# Patient Record
Sex: Male | Born: 1990
Health system: Southern US, Community
[De-identification: ages and names within clinical notes are randomized; demographics above are authoritative.]

## PROBLEM LIST (undated history)

## (undated) DIAGNOSIS — F909 Attention-deficit hyperactivity disorder, unspecified type: Secondary | ICD-10-CM

## (undated) DIAGNOSIS — E079 Disorder of thyroid, unspecified: Secondary | ICD-10-CM

## (undated) DIAGNOSIS — T7840XA Allergy, unspecified, initial encounter: Secondary | ICD-10-CM

## (undated) DIAGNOSIS — F1190 Opioid use, unspecified, uncomplicated: Secondary | ICD-10-CM

## (undated) HISTORY — DX: Allergy, unspecified, initial encounter: T78.40XA

## (undated) HISTORY — DX: Attention-deficit hyperactivity disorder, unspecified type: F90.9

## (undated) HISTORY — DX: Disorder of thyroid, unspecified: E07.9

---
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
## 2009-10-19 ENCOUNTER — Emergency Department (HOSPITAL_COMMUNITY): Admission: EM | Admit: 2009-10-19 | Discharge: 2009-10-19 | Payer: Self-pay | Admitting: Emergency Medicine

## 2010-04-09 ENCOUNTER — Emergency Department (HOSPITAL_COMMUNITY): Admission: EM | Admit: 2010-04-09 | Discharge: 2010-04-09 | Payer: Self-pay | Admitting: Emergency Medicine

## 2010-06-23 ENCOUNTER — Inpatient Hospital Stay (HOSPITAL_COMMUNITY): Admission: EM | Admit: 2010-06-23 | Discharge: 2010-06-25 | Payer: Self-pay | Source: Home / Self Care

## 2010-09-27 LAB — POCT CARDIAC MARKERS
CKMB, poc: 1.4 ng/mL (ref 1.0–8.0)
CKMB, poc: 1.4 ng/mL (ref 1.0–8.0)
Myoglobin, poc: 83.2 ng/mL (ref 12–200)
Myoglobin, poc: 91.5 ng/mL (ref 12–200)
Troponin i, poc: <0.05 ng/mL (ref 0.00–0.09)

## 2010-09-27 LAB — HEPATIC FUNCTION PANEL
ALT: 20 U/L (ref 0–53)
AST: 21 U/L (ref 0–37)
Alkaline Phosphatase: 110 U/L (ref 39–117)
Indirect Bilirubin: 1.1 mg/dL — ABNORMAL HIGH (ref 0.3–0.9)
Indirect Bilirubin: 2.1 mg/dL — ABNORMAL HIGH (ref 0.3–0.9)
Total Bilirubin: 1.3 mg/dL — ABNORMAL HIGH (ref 0.3–1.2)
Total Protein: 8.3 g/dL (ref 6.0–8.3)

## 2010-09-27 LAB — BASIC METABOLIC PANEL
BUN: 19 mg/dL (ref 6–23)
CO2: 10 mEq/L — ABNORMAL LOW (ref 19–32)
CO2: 19 mEq/L (ref 19–32)
CO2: 20 mEq/L (ref 19–32)
Calcium: 9 mg/dL (ref 8.4–10.5)
Calcium: 9.4 mg/dL (ref 8.4–10.5)
Chloride: 103 mEq/L (ref 96–112)
Chloride: 106 mEq/L (ref 96–112)
Chloride: 106 mEq/L (ref 96–112)
Creatinine, Ser: 0.96 mg/dL (ref 0.4–1.5)
Creatinine, Ser: 1.22 mg/dL (ref 0.4–1.5)
Creatinine, Ser: 2.12 mg/dL — ABNORMAL HIGH (ref 0.4–1.5)
GFR calc Af Amer: 56 mL/min — ABNORMAL LOW (ref >60)
GFR calc Af Amer: >60 mL/min (ref >60)
GFR calc Af Amer: >60 mL/min (ref >60)
GFR calc Af Amer: >60 mL/min (ref >60)
GFR calc non Af Amer: 40 mL/min — ABNORMAL LOW (ref >60)
GFR calc non Af Amer: >60 mL/min (ref >60)
GFR calc non Af Amer: >60 mL/min (ref >60)
Potassium: 4 mEq/L (ref 3.5–5.1)
Sodium: 135 mEq/L (ref 135–145)
Sodium: 136 mEq/L (ref 135–145)

## 2010-09-27 LAB — CBC
Hemoglobin: 15.2 g/dL (ref 13.0–17.0)
Hemoglobin: 15.2 g/dL (ref 13.0–17.0)
MCH: 32.5 pg (ref 26.0–34.0)
MCH: 32.5 pg (ref 26.0–34.0)
MCV: 87.8 fL (ref 78.0–100.0)
MCV: 88.5 fL (ref 78.0–100.0)
Platelets: 196 10*3/uL (ref 150–400)
Platelets: 284 10*3/uL (ref 150–400)
RBC: 4.68 MIL/uL (ref 4.22–5.81)
RBC: 4.68 MIL/uL (ref 4.22–5.81)
RDW: 12.9 % (ref 11.5–15.5)
WBC: 23.2 10*3/uL — ABNORMAL HIGH (ref 4.0–10.5)

## 2010-09-27 LAB — DIFFERENTIAL
Basophils Absolute: 0 10*3/uL (ref 0.0–0.1)
Eosinophils Absolute: 0 10*3/uL (ref 0.0–0.7)
Eosinophils Absolute: 0.1 10*3/uL (ref 0.0–0.7)
Eosinophils Relative: 0 % (ref 0–5)
Eosinophils Relative: 1 % (ref 0–5)
Lymphocytes Relative: 8 % — ABNORMAL LOW (ref 12–46)
Lymphs Abs: 1 10*3/uL (ref 0.7–4.0)
Lymphs Abs: 1.6 10*3/uL (ref 0.7–4.0)
Monocytes Relative: 9 % (ref 3–12)
Monocytes Relative: 9 % (ref 3–12)
Neutro Abs: 19.8 10*3/uL — ABNORMAL HIGH (ref 1.7–7.7)
Neutrophils Relative %: 81 % — ABNORMAL HIGH (ref 43–77)
Neutrophils Relative %: 86 % — ABNORMAL HIGH (ref 43–77)

## 2010-09-27 LAB — GLUCOSE, CAPILLARY
Comment 1: 326631
Glucose-Capillary: 130 mg/dL — ABNORMAL HIGH (ref 70–99)
Glucose-Capillary: 137 mg/dL — ABNORMAL HIGH (ref 70–99)
Glucose-Capillary: 138 mg/dL — ABNORMAL HIGH (ref 70–99)
Glucose-Capillary: 157 mg/dL — ABNORMAL HIGH (ref 70–99)
Glucose-Capillary: 161 mg/dL — ABNORMAL HIGH (ref 70–99)
Glucose-Capillary: 171 mg/dL — ABNORMAL HIGH (ref 70–99)
Glucose-Capillary: 180 mg/dL — ABNORMAL HIGH (ref 70–99)
Glucose-Capillary: 184 mg/dL — ABNORMAL HIGH (ref 70–99)
Glucose-Capillary: 193 mg/dL — ABNORMAL HIGH (ref 70–99)
Glucose-Capillary: 197 mg/dL — ABNORMAL HIGH (ref 70–99)
Glucose-Capillary: 201 mg/dL — ABNORMAL HIGH (ref 70–99)
Glucose-Capillary: 243 mg/dL — ABNORMAL HIGH (ref 70–99)
Glucose-Capillary: 466 mg/dL — ABNORMAL HIGH (ref 70–99)
Glucose-Capillary: 480 mg/dL — ABNORMAL HIGH (ref 70–99)

## 2010-09-27 LAB — URINALYSIS, ROUTINE W REFLEX MICROSCOPIC: Urobilinogen, UA: 0.2 mg/dL (ref 0.0–1.0)

## 2010-09-27 LAB — POTASSIUM
Potassium: 3.2 mEq/L — ABNORMAL LOW (ref 3.5–5.1)
Potassium: 3.2 mEq/L — ABNORMAL LOW (ref 3.5–5.1)

## 2010-09-27 LAB — CARDIAC PANEL(CRET KIN+CKTOT+MB+TROPI)
Relative Index: INVALID (ref 0.0–2.5)
Troponin I: 0.01 ng/mL (ref 0.00–0.06)

## 2010-09-27 LAB — CK TOTAL AND CKMB (NOT AT ARMC)
CK, MB: 2.8 ng/mL (ref 0.3–4.0)
Relative Index: INVALID (ref 0.0–2.5)
Total CK: 54 U/L (ref 7–232)

## 2010-09-27 LAB — URINE MICROSCOPIC-ADD ON

## 2010-10-06 LAB — DIFFERENTIAL
Eosinophils Absolute: 0.1 10*3/uL (ref 0.0–0.7)
Lymphs Abs: 1.3 10*3/uL (ref 0.7–4.0)
Monocytes Relative: 6 % (ref 3–12)
Neutrophils Relative %: 75 % (ref 43–77)

## 2010-10-06 LAB — URINALYSIS, ROUTINE W REFLEX MICROSCOPIC
Bilirubin Urine: NEGATIVE
Glucose, UA: >1000 mg/dL — AB
Hgb urine dipstick: NEGATIVE
Ketones, ur: NEGATIVE mg/dL
Specific Gravity, Urine: 1.005 (ref 1.005–1.030)
pH: 5.5 (ref 5.0–8.0)

## 2010-10-06 LAB — CBC
MCV: 92.8 fL (ref 78.0–100.0)
RBC: 4.95 MIL/uL (ref 4.22–5.81)
WBC: 7.1 10*3/uL (ref 4.0–10.5)

## 2010-10-06 LAB — RAPID URINE DRUG SCREEN, HOSP PERFORMED
Amphetamines: NOT DETECTED
Barbiturates: NOT DETECTED
Cocaine: POSITIVE — AB
Opiates: NOT DETECTED
Tetrahydrocannabinol: POSITIVE — AB

## 2010-10-06 LAB — BASIC METABOLIC PANEL
Chloride: 96 mEq/L (ref 96–112)
Creatinine, Ser: 0.94 mg/dL (ref 0.4–1.5)
Potassium: 3.9 mEq/L (ref 3.5–5.1)
Sodium: 134 mEq/L — ABNORMAL LOW (ref 135–145)

## 2010-10-06 LAB — POCT CARDIAC MARKERS
CKMB, poc: <1 ng/mL — ABNORMAL LOW (ref 1.0–8.0)
Myoglobin, poc: 44 ng/mL (ref 12–200)

## 2010-10-06 LAB — GLUCOSE, CAPILLARY: Glucose-Capillary: 298 mg/dL — ABNORMAL HIGH (ref 70–99)

## 2010-12-03 NOTE — Procedures (Signed)
NAME:  Chase Benson, Chase Benson                        ACCOUNT NO.:  [id number]   MEDICAL RECORD NO.:  [id number]                   PATIENT TYPE:  EMS   LOCATION:  ED                                   FACILITY:  [facility]   PHYSICIAN:  Edward L. Juanetta Gosling, M.D.             DATE OF BIRTH:  [birth year]   DATE OF PROCEDURE:  DATE OF DISCHARGE:                                EKG INTERPRETATION   FINDINGS:  1. The rhythm is probably sinus arrhythmia with a general rate in the 60's.     There are extensive ST-T wave abnormalities which may be due to early     repolarization, but could be from other cause.  Clinical correlation is     suggested.  2. There is short PR interval.   IMPRESSION:  Abnormal electrocardiogram.      ___________________________________________                                            Oneal Deputy. Juanetta Gosling, M.D.   ELH/MEDQ  D:  11/13/2003  T:  11/13/2003  Job:  [id number]

## 2010-12-03 NOTE — H&P (Signed)
[facility]  Patient:    Chase Benson, Chase Benson Visit Number: [id number] MRN: [id number]          Service Type: MED Location: 3A A328 01 Attending Physician:  Harlow Asa Dictated by:   Donna Bernard, M.D. Admit Date:  02/07/2002                           History and Physical  CHIEF COMPLAINT:  Frequent urination and low back discomfort.  SUBJECTIVE:  This patient is a soon to be 20 year old white male with a relatively benign prior medical history including ADHD who presented to the office day of admission with complaints of frequent urination.  The father noted last week that he was peeing more often and drinking more often.  In the last couple of days the patient had more difficulty with this.  He also noted some mild low back ache accompanied by some mild headache.  No fever.  He took no medications other than Aleve for his symptoms.  The patient has had no change in alertness or awareness.  He notes no dysuria.  He has had no respiratory symptoms.  No cough.  No fever.  CURRENT MEDICATIONS:  None.  During the school year he takes Ritalin 20 SR daily.  PAST MEDICAL HISTORY:  Significant for ADHD.  Also, patient had a transient period of hematuria which resolved spontaneously several years ago.  PAST SURGICAL HISTORY:  None.  PRIOR ADMISSIONS:  None.  FAMILY HISTORY:  Positive for both type 1 and type 2 diabetes.  SOCIAL HISTORY:  Lives with parents.  No significant smoke exposure.  REVIEW OF SYSTEMS:  Otherwise vital signs stable.  PHYSICAL EXAMINATION  VITAL SIGNS:  Blood pressure 112/70, afebrile.  Random glucose in the office 276, weight 86 pounds.  GENERAL:  The patient is alert, in no acute distress.  HEENT:  Normal.  NECK:  Supple.  LUNGS:  Clear.  HEART:  Regular rate and rhythm.  ABDOMEN:  Benign.  No CVA tenderness.  EXTREMITIES:  Normal.  NEUROLOGIC:  Intact.  SKIN:  Normal.  SIGNIFICANT LABORATORIES:  Random  sugar at the office 276.  Urinalysis at the office showed positive glycosuria.  Blood work at hospital, MET-7 normal except for glucose 158, hemoglobin A1C level pending.  IMPRESSION:  New onset diabetes with no ketosis at this time.  Blood ketone levels were negative.  Bicarbonate in fact is 29.  With the patients habitus, family history of type 1, relatively acute onset of symptoms, I think the likelihood is this is type 1 diabetes.  This is discussed with family.  PLAN:  As per orders.  Significant time spent with the family in discussion of diagnosis and underlying cause and long-term prognosis and treatment. Dictated by:   Donna Bernard, M.D. Attending Physician:  Harlow Asa DD:  02/07/02 TD:  02/11/02 Job: [id number]

## 2010-12-03 NOTE — Discharge Summary (Signed)
   NAME:  Chase Benson, Chase Benson                        ACCOUNT NO.:  [id number]   MEDICAL RECORD NO.:  [id number]                   PATIENT TYPE:  INP   LOCATION:  A328                                 FACILITY:  [facility]   PHYSICIAN:  Donna Bernard, M.D.             DATE OF BIRTH:  [birth year]   DATE OF ADMISSION:  02/07/2002  DATE OF DISCHARGE:  02/10/2002                                 DISCHARGE SUMMARY   FINAL DIAGNOSIS:  New onset type 1 diabetes.   FINAL DISPOSITION:  1. The patient is discharged to home.  2. Discharge insulin:  Humulin 70/30 eight units q.a.m. and 4 units q.p.m.     Humulin R 2 units in addition to the 70/30 for glucose readings above     400.  Glucagon injection prescribed to be used in the events of severe     hypoglycemia.  3. Followup in the office within several days.  4. A 1700 ADA diet.   HISTORY AND PHYSICAL:  Please see H&P as dictated   HOSPITAL COURSE:  This patient is a 20 year old white male who presented  with a history of frequent urination for the week prior to admission.  Sugar  in the office revealed a level of 276.  Based on new onset type 1 diabetes,  the patient was admitted to the hospital.  He was put on a sliding scale.  The patient was administered IV fluids. Over the next several days, we  participated in intensive education of the patient in regards to his  condition.  The family and patient were taught appropriate survival skills  for management of type 1 diabetes.  Of note, hemoglobin A1c came back  positive at 8.8%.  In addition, the blood work supported the presence of no  ketosis at the time of presentation.  The day of discharge, the patient was  feeling better and he was discharged home with diagnosis and disposition as  noted above.                                                Donna Bernard, M.D.    WSL/MEDQ  D:  04/02/2002  T:  04/03/2002  Job:  [id number]

## 2012-02-24 ENCOUNTER — Emergency Department (HOSPITAL_COMMUNITY)
Admission: EM | Admit: 2012-02-24 | Discharge: 2012-02-24 | Disposition: A | Discharge status: Home / Self Care | Payer: Self-pay | Attending: Emergency Medicine | Admitting: Emergency Medicine

## 2012-02-24 ENCOUNTER — Encounter (HOSPITAL_COMMUNITY): Payer: Self-pay | Admitting: *Deleted

## 2012-02-24 DIAGNOSIS — Z79899 Other long term (current) drug therapy: Secondary | ICD-10-CM | POA: Insufficient documentation

## 2012-02-24 DIAGNOSIS — F17200 Nicotine dependence, unspecified, uncomplicated: Secondary | ICD-10-CM | POA: Insufficient documentation

## 2012-02-24 DIAGNOSIS — K089 Disorder of teeth and supporting structures, unspecified: Secondary | ICD-10-CM | POA: Insufficient documentation

## 2012-02-24 DIAGNOSIS — Z794 Long term (current) use of insulin: Secondary | ICD-10-CM | POA: Insufficient documentation

## 2012-02-24 DIAGNOSIS — K0889 Other specified disorders of teeth and supporting structures: Secondary | ICD-10-CM

## 2012-02-24 DIAGNOSIS — E119 Type 2 diabetes mellitus without complications: Secondary | ICD-10-CM | POA: Insufficient documentation

## 2012-02-24 MED ORDER — HYDROCODONE-ACETAMINOPHEN 5-325 MG PO TABS: 1.0000 | ORAL_TABLET | Freq: Four times a day (QID) | ORAL | Status: AC | PRN

## 2012-02-24 MED ORDER — HYDROCODONE-ACETAMINOPHEN 5-325 MG PO TABS
1.0000 | ORAL_TABLET | Freq: Once | ORAL | Status: AC
  Administered 2012-02-24: 1 via ORAL
  Filled 2012-02-24: qty 1

## 2012-02-24 MED ORDER — PENICILLIN V POTASSIUM 500 MG PO TABS: 500.0000 mg | ORAL_TABLET | Freq: Four times a day (QID) | ORAL | Status: AC

## 2012-02-24 MED ORDER — PENICILLIN V POTASSIUM 250 MG PO TABS
500.0000 mg | ORAL_TABLET | Freq: Once | ORAL | Status: AC
  Administered 2012-02-24: 500 mg via ORAL
  Filled 2012-02-24 (×2): qty 1

## 2012-02-24 NOTE — ED Notes (Signed)
Dental pain lt mandibular molar 

## 2012-02-24 NOTE — ED Provider Notes (Signed)
History     CSN: [id number]  Arrival date & time 02/24/12  1400   First MD Initiated Contact with Patient 02/24/12 1426      Chief Complaint  Patient presents with  . Dental Pain    (Consider location/radiation/quality/duration/timing/severity/associated sxs/prior treatment) HPI Comments: Plans to see a local dentist next week.  Patient is a 21 y.o. male presenting with tooth pain. The history is provided by the patient. No language interpreter was used.  Dental PainThe primary symptoms include mouth pain. Primary symptoms do not include dental injury or fever. Episode onset: 3 days ago. The symptoms are unchanged. The symptoms occur constantly.  Additional symptoms include: dental sensitivity to temperature. Additional symptoms do not include: gum swelling, purulent gums, trismus and facial swelling.    Past Medical History  Diagnosis Date  . Diabetes mellitus     History reviewed. No pertinent past surgical history.  History reviewed. No pertinent family history.  History  Substance Use Topics  . Smoking status: Current Everyday Smoker  . Smokeless tobacco: Not on file  . Alcohol Use: No      Review of Systems  Constitutional: Negative for fever and chills.  HENT: Positive for dental problem. Negative for facial swelling.   All other systems reviewed and are negative.    Allergies  Sulfa antibiotics  Home Medications   Current Outpatient Rx  Name Route Sig Dispense Refill  . ASPIRIN-SALICYLAMIDE-CAFFEINE 325-95-16 MG PO TABS Oral Take 2-3 packets by mouth 3 (three) times daily as needed. For pain    . INSULIN REGULAR HUMAN 100 UNIT/ML IJ SOLN Subcutaneous Inject 50-75 Units into the skin 3 (three) times daily before meals. As directed per sliding scale.    Marland Kitchen HYDROCODONE-ACETAMINOPHEN 5-325 MG PO TABS Oral Take 1 tablet by mouth every 6 (six) hours as needed for pain. 20 tablet 0  . PENICILLIN V POTASSIUM 500 MG PO TABS Oral Take 1 tablet (500 mg total) by  mouth 4 (four) times daily. 40 tablet 0    BP 133/72  Pulse 72  Temp 97.5 F (36.4 C) (Oral)  Resp 20  Ht 6\' 1"  (1.854 m)  Wt 150 lb (68.04 kg)  BMI 19.79 kg/m2  SpO2 100%  Physical Exam  Nursing note and vitals reviewed. Constitutional: He is oriented to person, place, and time. He appears well-developed and well-nourished.  HENT:  Head: Normocephalic and atraumatic.  Mouth/Throat: Uvula is midline, oropharynx is clear and moist and mucous membranes are normal. Normal dentition. No dental abscesses or dental caries.    Eyes: EOM are normal.  Neck: Normal range of motion.  Cardiovascular: Normal rate, regular rhythm, normal heart sounds and intact distal pulses.   Pulmonary/Chest: Effort normal and breath sounds normal. No respiratory distress.  Abdominal: Soft. He exhibits no distension. There is no tenderness.  Musculoskeletal: Normal range of motion.  Neurological: He is alert and oriented to person, place, and time.  Skin: Skin is warm and dry.  Psychiatric: He has a normal mood and affect. Judgment normal.    ED Course  Procedures (including critical care time)  Labs Reviewed - No data to display No results found.   1. Pain, dental       MDM  rx-pen VK 500, 40 rx-hydrocodone, 20 F/u with dentist as planned.        Evalina Field, [place] 02/24/12 1512

## 2012-02-24 NOTE — ED Notes (Signed)
Pt c/o of toothache to left lower x 3 days, states he has a dentist in [place] but has not seen in 2 years, pt states that he will try to make an appt with him but nurse will provide list of dental services in the area as well

## 2012-02-25 NOTE — ED Provider Notes (Signed)
Medical screening examination/treatment/procedure(s) were performed by non-physician practitioner and as supervising physician I was immediately available for consultation/collaboration.  [name], MD 02/25/12 [phone]

## 2012-04-05 ENCOUNTER — Encounter (HOSPITAL_COMMUNITY): Payer: Self-pay | Admitting: Emergency Medicine

## 2012-04-05 ENCOUNTER — Emergency Department (HOSPITAL_COMMUNITY)
Admission: EM | Admit: 2012-04-05 | Discharge: 2012-04-05 | Disposition: A | Discharge status: Home / Self Care | Payer: Self-pay | Attending: Emergency Medicine | Admitting: Emergency Medicine

## 2012-04-05 DIAGNOSIS — F17200 Nicotine dependence, unspecified, uncomplicated: Secondary | ICD-10-CM | POA: Insufficient documentation

## 2012-04-05 DIAGNOSIS — E119 Type 2 diabetes mellitus without complications: Secondary | ICD-10-CM | POA: Insufficient documentation

## 2012-04-05 DIAGNOSIS — Z882 Allergy status to sulfonamides status: Secondary | ICD-10-CM | POA: Insufficient documentation

## 2012-04-05 DIAGNOSIS — K089 Disorder of teeth and supporting structures, unspecified: Secondary | ICD-10-CM | POA: Insufficient documentation

## 2012-04-05 DIAGNOSIS — K0889 Other specified disorders of teeth and supporting structures: Secondary | ICD-10-CM

## 2012-04-05 DIAGNOSIS — Z794 Long term (current) use of insulin: Secondary | ICD-10-CM | POA: Insufficient documentation

## 2012-04-05 MED ORDER — AMOXICILLIN 500 MG PO CAPS: 500.0000 mg | ORAL_CAPSULE | Freq: Three times a day (TID) | ORAL | Status: DC

## 2012-04-05 MED ORDER — HYDROCODONE-ACETAMINOPHEN 5-325 MG PO TABS: 1.0000 | ORAL_TABLET | ORAL | Status: DC | PRN

## 2012-04-05 NOTE — ED Notes (Signed)
Pt has 'toothache' his dentist can't see him until next week and told him to come to ED if pain got worse. Pt reports taking ibuprofen and BC powder for pain. Pt says it is difficult to eat. Pt reports pain at 8/10.

## 2012-04-05 NOTE — ED Provider Notes (Signed)
History     CSN: [id number]  Arrival date & time 04/05/12  1324   First MD Initiated Contact with Patient 04/05/12 1218      Chief Complaint  Patient presents with  . Dental Pain    (Consider location/radiation/quality/duration/timing/severity/associated sxs/prior treatment) Patient is a 21 y.o. male presenting with tooth pain. The history is provided by the patient.  Dental PainThe primary symptoms include mouth pain. Primary symptoms do not include shortness of breath or cough. The symptoms began 2 days ago. The symptoms are worsening. The symptoms occur constantly.  Additional symptoms include: jaw pain. Additional symptoms do not include: nosebleeds. Medical issues include: smoking.    Past Medical History  Diagnosis Date  . Diabetes mellitus     History reviewed. No pertinent past surgical history.  No family history on file.  History  Substance Use Topics  . Smoking status: Current Every Day Smoker  . Smokeless tobacco: Never Used  . Alcohol Use: Yes      Review of Systems  Constitutional: Negative for activity change.       All ROS Neg except as noted in HPI  HENT: Positive for dental problem. Negative for nosebleeds and neck pain.   Eyes: Negative for photophobia and discharge.  Respiratory: Negative for cough, shortness of breath and wheezing.   Cardiovascular: Negative for chest pain and palpitations.  Gastrointestinal: Negative for abdominal pain and blood in stool.  Genitourinary: Negative for dysuria, frequency and hematuria.  Musculoskeletal: Negative for back pain and arthralgias.  Skin: Negative.   Neurological: Negative for dizziness, seizures and speech difficulty.  Psychiatric/Behavioral: Negative for hallucinations and confusion.    Allergies  Sulfa antibiotics  Home Medications   Current Outpatient Rx  Name Route Sig Dispense Refill  . ASPIRIN-SALICYLAMIDE-CAFFEINE 325-95-16 MG PO TABS Oral Take 2-3 packets by mouth 3 (three) times  daily as needed. For pain    . INSULIN REGULAR HUMAN 100 UNIT/ML IJ SOLN Subcutaneous Inject 50-75 Units into the skin 3 (three) times daily before meals. As directed per sliding scale.      BP 100/60  Temp 97.1 F (36.2 C) (Oral)  SpO2 100%  Physical Exam  Nursing note and vitals reviewed. Constitutional: He is oriented to person, place, and time. He appears well-developed and well-nourished.  Non-toxic appearance.  HENT:  Head: Normocephalic.  Right Ear: Tympanic membrane and external ear normal.  Left Ear: Tympanic membrane and external ear normal.       Pain to palpation of the left lower 1st and 2nd molar areas. Mild gum swelling present. No visible abscess. Airway patent. No swelling under the tongue.  Eyes: EOM and lids are normal. Pupils are equal, round, and reactive to light.  Neck: Normal range of motion. Neck supple. Carotid bruit is not present.  Cardiovascular: Normal rate, regular rhythm, normal heart sounds, intact distal pulses and normal pulses.   Pulmonary/Chest: Breath sounds normal. No respiratory distress.  Abdominal: Soft. Bowel sounds are normal. There is no tenderness. There is no guarding.  Musculoskeletal: Normal range of motion.  Lymphadenopathy:       Head (right side): No submandibular adenopathy present.       Head (left side): No submandibular adenopathy present.    He has no cervical adenopathy.  Neurological: He is alert and oriented to person, place, and time. He has normal strength. No cranial nerve deficit or sensory deficit.  Skin: Skin is warm and dry.  Psychiatric: He has a normal mood and affect. His speech  is normal.    ED Course  Procedures (including critical care time)  Labs Reviewed - No data to display No results found.   No diagnosis found.    MDM  I have reviewed nursing notes, vital signs, and all appropriate lab and imaging results for this patient. Pt has problem with toothache on the left lower jaw. He is scheduled for  dental evaluation, but was told to come to the ED for assistance with pain. He is diabetic. Glucose this AM  154. Will treat with amoxil and norco. Pt advised to see Dr Gerda Diss, who manages his glucose for approval of use of these medications.       Chase Benson, [place] 04/05/12 1243

## 2012-04-05 NOTE — ED Provider Notes (Signed)
Medical screening examination/treatment/procedure(s) were performed by non-physician practitioner and as supervising physician I was immediately available for consultation/collaboration.   Benny Lennert, MD 04/05/12 [phone]

## 2013-03-05 ENCOUNTER — Other Ambulatory Visit: Payer: Self-pay | Admitting: *Deleted

## 2013-03-05 ENCOUNTER — Telehealth: Payer: Self-pay | Admitting: Family Medicine

## 2013-03-05 MED ORDER — INSULIN REGULAR HUMAN 100 UNIT/ML IJ SOLN: 50.0000 [IU] | Freq: Three times a day (TID) | INTRAMUSCULAR | Status: DC

## 2013-03-05 NOTE — Telephone Encounter (Signed)
Refill times one. Pt needs to see his diabetes specialist! I don't manage type 1 at his age and he knows that

## 2013-03-05 NOTE — Telephone Encounter (Signed)
FYI - PATIENT STATES HE DOESN'T HAVE A SPECIALIST AND HASN'T SEEN A DOCTOR SINCE HE SAW YOU IN JAN 2013. STATES HE IS GOING TO MAKE AN APPT. HERE TO GET REFERRED TO A DIABETIC DOCTOR SOON.   Med sent electronically to [facility] in [place]. Patient notified.

## 2013-03-05 NOTE — Telephone Encounter (Signed)
LAST SEEN JAN 2013
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
## 2013-06-19 ENCOUNTER — Ambulatory Visit (INDEPENDENT_AMBULATORY_CARE_PROVIDER_SITE_OTHER): Payer: Self-pay | Admitting: Family Medicine

## 2013-06-19 ENCOUNTER — Encounter: Payer: Self-pay | Admitting: Family Medicine

## 2013-06-19 VITALS — BP 138/88 | Ht 73.0 in | Wt 183.2 lb

## 2013-06-19 DIAGNOSIS — E1149 Type 2 diabetes mellitus with other diabetic neurological complication: Secondary | ICD-10-CM

## 2013-06-19 DIAGNOSIS — E039 Hypothyroidism, unspecified: Secondary | ICD-10-CM

## 2013-06-19 DIAGNOSIS — E1142 Type 2 diabetes mellitus with diabetic polyneuropathy: Secondary | ICD-10-CM

## 2013-06-19 DIAGNOSIS — E1140 Type 2 diabetes mellitus with diabetic neuropathy, unspecified: Secondary | ICD-10-CM

## 2013-06-19 DIAGNOSIS — E119 Type 2 diabetes mellitus without complications: Secondary | ICD-10-CM

## 2013-06-19 DIAGNOSIS — Z794 Long term (current) use of insulin: Secondary | ICD-10-CM

## 2013-06-19 DIAGNOSIS — E109 Type 1 diabetes mellitus without complications: Secondary | ICD-10-CM

## 2013-06-19 LAB — POCT GLYCOSYLATED HEMOGLOBIN (HGB A1C): Hemoglobin A1C: 9.2

## 2013-06-19 MED ORDER — INSULIN NPH ISOPHANE & REGULAR (70-30) 100 UNIT/ML ~~LOC~~ SUSP: SUBCUTANEOUS | Status: DC

## 2013-06-19 MED ORDER — INSULIN GLARGINE 100 UNIT/ML ~~LOC~~ SOLN: 34.0000 [IU] | Freq: Every day | SUBCUTANEOUS | Status: DC

## 2013-06-19 MED ORDER — NORTRIPTYLINE HCL 25 MG PO CAPS: 25.0000 mg | ORAL_CAPSULE | Freq: Every day | ORAL | Status: DC

## 2013-06-19 MED ORDER — LEVOTHYROXINE SODIUM 100 MCG PO TABS: ORAL_TABLET | ORAL | Status: DC

## 2013-06-19 NOTE — Patient Instructions (Signed)
Increase lantus to 34  Add two units to each basal dose for each meal

## 2013-06-19 NOTE — Progress Notes (Signed)
   Subjective:    Patient ID: Chase Benson, male    DOB: [birth year], 22 y.o.   MRN: [id number]  HPI Patient is here today for diabetic check up.   Patient states his blood sugars have been in the 200's. 180 and 210. On sliding scale of the 12 each am bs i120, then divide by 42, then add  10 base plus same bs  10 base plus same bs  Exercising regularly, work physically demanding, a lot of physicl acticity  One low sugar spell recntly,  Still on lantus 27 units daily, takes regularly. Uses relion equivalent thru [facility].  Notes numbness and tingling in feet, pins and needles, worse in the eve. At times painful in the evening. Sharp lancinating pain running through both feet.  Reports history of low thyroid. States compliant with medications. Requests refill. Has not had her blood work in a long time. No symptoms of hiatal low thyroid. States it definitely helps him no chest pain no headache no weight loss no weight gain no abdominal pain no change in bowel habits ROS otherwise negative  Last eye doc visit, one yr ago,     Results for orders placed in visit on 06/19/13  POCT GLYCOSYLATED HEMOGLOBIN (HGB A1C)      Result Value Range   Hemoglobin A1C 9.2     Working full time,   Review of Systems See above    Objective:   Physical Exam  Alert no apparent distress. HEENT normal. Lungs clear. Heart regular in rhythm. Ankles without edema. See a foot exam     Assessment & Plan:  Impression #1 type 1 diabetes. #2 history of ongoing noncompliance though improved lately. #3 diabetic neuropathy discussed at length. #4 hypothyroidism status uncertain. Plan insulin refilled. Encouraged to see eye Dr. Stay compliant with meds. Followup as scheduled. Blood work one week before next visit. Easily 30 spent minutes spent most in discussion. Lantus increased. Had 2 units of basal rapid acting insulin teaching meal. WSL

## 2013-06-20 ENCOUNTER — Telehealth: Payer: Self-pay | Admitting: *Deleted

## 2013-06-20 ENCOUNTER — Other Ambulatory Visit: Payer: Self-pay | Admitting: *Deleted

## 2013-06-20 NOTE — Telephone Encounter (Signed)
Pt stated he receive wrong insulin medication at the pharmacy. The wrong medication he picked up was Novolin 70/30.Marland KitchenThe right medication should been Novolin Regular 3 vials. Pt # [phone] [facility]acy [facility]

## 2013-06-20 NOTE — Telephone Encounter (Signed)
Called [facility] canceled refills on the insulin that was sent in wrong. Refilled the novolin regular 3 vial. [facility] stated he paid #24.88 for the insulin and he cannot return. Spoke with Print production planner. Pt to bring the receipt to the office and cone will issue him a refund.

## 2013-06-23 DIAGNOSIS — E039 Hypothyroidism, unspecified: Secondary | ICD-10-CM | POA: Insufficient documentation

## 2013-06-23 DIAGNOSIS — E109 Type 1 diabetes mellitus without complications: Secondary | ICD-10-CM | POA: Insufficient documentation

## 2013-06-23 DIAGNOSIS — E1140 Type 2 diabetes mellitus with diabetic neuropathy, unspecified: Secondary | ICD-10-CM | POA: Insufficient documentation

## 2013-08-10 ENCOUNTER — Encounter: Payer: Self-pay | Admitting: *Deleted

## 2013-08-13 ENCOUNTER — Telehealth: Payer: Self-pay | Admitting: *Deleted

## 2013-08-13 ENCOUNTER — Encounter: Payer: Self-pay | Admitting: Family Medicine

## 2013-08-13 ENCOUNTER — Ambulatory Visit (INDEPENDENT_AMBULATORY_CARE_PROVIDER_SITE_OTHER): Payer: BC Managed Care – PPO | Admitting: Family Medicine

## 2013-08-13 ENCOUNTER — Other Ambulatory Visit: Payer: Self-pay | Admitting: *Deleted

## 2013-08-13 VITALS — BP 122/80 | Ht 73.0 in | Wt 171.0 lb

## 2013-08-13 DIAGNOSIS — E109 Type 1 diabetes mellitus without complications: Secondary | ICD-10-CM

## 2013-08-13 DIAGNOSIS — E1140 Type 2 diabetes mellitus with diabetic neuropathy, unspecified: Secondary | ICD-10-CM

## 2013-08-13 DIAGNOSIS — E039 Hypothyroidism, unspecified: Secondary | ICD-10-CM

## 2013-08-13 DIAGNOSIS — Z0000 Encounter for general adult medical examination without abnormal findings: Secondary | ICD-10-CM

## 2013-08-13 DIAGNOSIS — E1142 Type 2 diabetes mellitus with diabetic polyneuropathy: Secondary | ICD-10-CM

## 2013-08-13 DIAGNOSIS — E1149 Type 2 diabetes mellitus with other diabetic neurological complication: Secondary | ICD-10-CM

## 2013-08-13 DIAGNOSIS — Z79899 Other long term (current) drug therapy: Secondary | ICD-10-CM

## 2013-08-13 MED ORDER — INSULIN GLARGINE 100 UNIT/ML ~~LOC~~ SOLN: 40.0000 [IU] | Freq: Every day | SUBCUTANEOUS | Status: DC

## 2013-08-13 MED ORDER — GABAPENTIN 100 MG PO CAPS: 100.0000 mg | ORAL_CAPSULE | Freq: Every day | ORAL | Status: DC

## 2013-08-13 NOTE — Telephone Encounter (Signed)
We added six units to equal forty

## 2013-08-13 NOTE — Progress Notes (Signed)
   Subjective:    Patient ID: Chase Benson, male    DOB: 01-Apr-1991, 23 y.o.   MRN: [id number]  HPI Patient is here today for a physical.   Pt states blood sugars are coming down, but they are still a little high.   He quit taking the nortriptyline due to the side effects of making him feel fatigue, lethargic, and his blood sugar would crash in the middle of the night.  Results for orders placed in visit on 06/19/13  POCT GLYCOSYLATED HEMOGLOBIN (HGB A1C)      Result Value Range   Hemoglobin A1C 9.2     Sugars up to 180s  Still having painful neuropathy, nortryp causede morn drowsiness  Thyroid med mostly compliant--maybe misses one dose a week  Smoking one half pk a day--working it down  Review of Systems  Constitutional: Negative for fever, activity change and appetite change.  HENT: Negative for congestion and rhinorrhea.   Eyes: Negative for discharge.  Respiratory: Negative for cough and wheezing.   Cardiovascular: Negative for chest pain.  Gastrointestinal: Negative for vomiting, abdominal pain and blood in stool.  Genitourinary: Negative for frequency and difficulty urinating.  Musculoskeletal: Negative for neck pain.  Skin: Negative for rash.  Allergic/Immunologic: Negative for environmental allergies and food allergies.  Neurological: Negative for weakness and headaches.  Psychiatric/Behavioral: Negative for agitation.       Objective:   Physical Exam  Vitals reviewed. Constitutional: He appears well-developed and well-nourished.  HENT:  Head: Normocephalic and atraumatic.  Right Ear: External ear normal.  Left Ear: External ear normal.  Nose: Nose normal.  Mouth/Throat: Oropharynx is clear and moist.  Eyes: EOM are normal. Pupils are equal, round, and reactive to light.  Neck: Normal range of motion. Neck supple. No thyromegaly present.  Cardiovascular: Normal rate, regular rhythm and normal heart sounds.   No murmur heard. Pulmonary/Chest: Effort  normal and breath sounds normal. No respiratory distress. He has no wheezes.  Abdominal: Soft. Bowel sounds are normal. He exhibits no distension and no mass. There is no tenderness.  Genitourinary: Penis normal.  Musculoskeletal: Normal range of motion. He exhibits no edema.  Lymphadenopathy:    He has no cervical adenopathy.  Neurological: He is alert. He exhibits normal muscle tone.  Skin: Skin is warm and dry. No erythema.  Diminished sensation distal feet.  Psychiatric: He has a normal mood and affect. His behavior is normal. Judgment normal.          Assessment & Plan:  Impression 1 wellness exam #2 diabetes type 1 poor control. No improving. #3 hypothyroidism. #4 diabetic neuropathy discussed plan add 6 units of Lantus. Diet exercise discussed. Referral to endocrinologist. Stop nortriptyline. WSL

## 2013-08-14 LAB — LIPID PANEL
CHOL/HDL RATIO: 2.9 ratio
CHOLESTEROL: 129 mg/dL (ref 0–200)
HDL: 45 mg/dL (ref >39)
LDL CALC: 68 mg/dL (ref 0–99)
TRIGLYCERIDES: 82 mg/dL (ref <150)
VLDL: 16 mg/dL (ref 0–40)

## 2013-08-14 LAB — HEPATIC FUNCTION PANEL
ALT: 10 U/L (ref 0–53)
AST: 12 U/L (ref 0–37)
Albumin: 4.4 g/dL (ref 3.5–5.2)
Alkaline Phosphatase: 80 U/L (ref 39–117)
BILIRUBIN INDIRECT: 0.5 mg/dL (ref 0.2–1.2)
Bilirubin, Direct: 0.2 mg/dL (ref 0.0–0.3)
TOTAL PROTEIN: 6.6 g/dL (ref 6.0–8.3)
Total Bilirubin: 0.7 mg/dL (ref 0.2–1.2)

## 2013-08-14 LAB — BASIC METABOLIC PANEL
BUN: 15 mg/dL (ref 6–23)
CALCIUM: 9.2 mg/dL (ref 8.4–10.5)
CHLORIDE: 105 meq/L (ref 96–112)
CO2: 30 mEq/L (ref 19–32)
CREATININE: 0.78 mg/dL (ref 0.50–1.35)
Glucose, Bld: 170 mg/dL — ABNORMAL HIGH (ref 70–99)
Potassium: 4 mEq/L (ref 3.5–5.3)
Sodium: 141 mEq/L (ref 135–145)

## 2013-08-15 LAB — TSH: TSH: 0.877 u[IU]/mL (ref 0.350–4.500)

## 2013-08-15 LAB — MICROALBUMIN, URINE: MICROALB UR: 1.12 mg/dL (ref 0.00–1.89)

## 2013-08-22 ENCOUNTER — Encounter: Payer: Self-pay | Admitting: Family Medicine

## 2013-08-22 ENCOUNTER — Ambulatory Visit (HOSPITAL_COMMUNITY)
Admission: RE | Admit: 2013-08-22 | Discharge: 2013-08-22 | Disposition: A | Discharge status: Home / Self Care | Payer: BC Managed Care – PPO | Source: Ambulatory Visit | Attending: Family Medicine | Admitting: Family Medicine

## 2013-08-22 ENCOUNTER — Ambulatory Visit (INDEPENDENT_AMBULATORY_CARE_PROVIDER_SITE_OTHER): Payer: BC Managed Care – PPO | Admitting: Family Medicine

## 2013-08-22 VITALS — BP 118/70 | Temp 98.3°F | Ht 73.0 in | Wt 174.1 lb

## 2013-08-22 DIAGNOSIS — L0291 Cutaneous abscess, unspecified: Secondary | ICD-10-CM

## 2013-08-22 DIAGNOSIS — M79609 Pain in unspecified limb: Secondary | ICD-10-CM | POA: Insufficient documentation

## 2013-08-22 DIAGNOSIS — L0390 Cellulitis, unspecified: Secondary | ICD-10-CM

## 2013-08-22 DIAGNOSIS — M79672 Pain in left foot: Secondary | ICD-10-CM

## 2013-08-22 MED ORDER — DOXYCYCLINE HYCLATE 100 MG PO TABS: 100.0000 mg | ORAL_TABLET | Freq: Two times a day (BID) | ORAL | Status: DC

## 2013-08-22 MED ORDER — MUPIROCIN CALCIUM 2 % EX CREA: 1.0000 "application " | TOPICAL_CREAM | Freq: Two times a day (BID) | CUTANEOUS | Status: DC

## 2013-08-22 NOTE — Progress Notes (Signed)
   Subjective:    Patient ID: Chase Benson, male    DOB: [birth year], 23 y.o.   MRN: [id number]  HPI Patient has a open wound on his left foot. Patient states that it is very painful. Redness is noted. It has been present for about 3 days now. Patient is a Type I diabetic. Patient had an item that weighed about 100 pounds fall on his left foot then the sore appeared.   No fever no chills no change in appetite no rash elsewhere. ROS otherwise negative Review of Systems See above    Objective:   Physical Exam  Alert no apparent distress. Lungs clear. Heart regular in rhythm. H&T normal. Left foot dorsal ulceration. No frank discharge. Moderate erythema and tender.  X-ray negative      Assessment & Plan:  Impression contusion with skin injury and secondary cellulitis complicated by neuropathy. And warning signs discussed. Doxy twice a day 10 days. Symptomatic care discussed. WSL

## 2013-08-23 NOTE — Progress Notes (Signed)
Patient notified and verbalized understanding of the test results. No further questions. 

## 2013-10-01 ENCOUNTER — Ambulatory Visit (INDEPENDENT_AMBULATORY_CARE_PROVIDER_SITE_OTHER): Payer: BC Managed Care – PPO | Admitting: Family Medicine

## 2013-10-01 ENCOUNTER — Encounter: Payer: Self-pay | Admitting: Family Medicine

## 2013-10-01 VITALS — BP 128/70 | Ht 73.0 in | Wt 168.0 lb

## 2013-10-01 DIAGNOSIS — E109 Type 1 diabetes mellitus without complications: Secondary | ICD-10-CM

## 2013-10-01 DIAGNOSIS — E1142 Type 2 diabetes mellitus with diabetic polyneuropathy: Secondary | ICD-10-CM

## 2013-10-01 DIAGNOSIS — E1149 Type 2 diabetes mellitus with other diabetic neurological complication: Secondary | ICD-10-CM

## 2013-10-01 DIAGNOSIS — E119 Type 2 diabetes mellitus without complications: Secondary | ICD-10-CM

## 2013-10-01 DIAGNOSIS — E1140 Type 2 diabetes mellitus with diabetic neuropathy, unspecified: Secondary | ICD-10-CM

## 2013-10-01 DIAGNOSIS — E039 Hypothyroidism, unspecified: Secondary | ICD-10-CM

## 2013-10-01 LAB — POCT GLYCOSYLATED HEMOGLOBIN (HGB A1C): Hemoglobin A1C: 8.2

## 2013-10-01 MED ORDER — MUPIROCIN CALCIUM 2 % EX CREA: 1.0000 "application " | TOPICAL_CREAM | Freq: Two times a day (BID) | CUTANEOUS | Status: DC

## 2013-10-01 NOTE — Progress Notes (Signed)
   Subjective:    Patient ID: Chase ApleyBenjamin L Hagerman, male    DOB: 1990/08/27, 23 y.o.   MRN: [id number]  [facility] Patient has type I diabetes. He is still waiting on referral to see specialist.   Pain in knees. Pain comes and goes. Has been worst the past 3 weeks. Recalls no acute injury.  Requesting refill on bactroban ointment. Uses it when he has a skin infection.  No low sugar spells. Has never had difficulty driving while having diabetes. No loss of consciousness.  Reports that the numbness in his feet seem to have improved.  Claims compliance with all of his medications.  Results for orders placed in visit on 10/01/13  POCT GLYCOSYLATED HEMOGLOBIN (HGB A1C)      Result Value Ref Range   Hemoglobin A1C 8.2           Review of Systems No chest pain no back pain no headache no change in bowel habits no blood in stool ROS otherwise negative    Objective:   Physical Exam Alert no apparent distress. Lungs clear. Heart regular in rhythm. H&T normal. Vital stable. Ankles without edema sensation diminished to absent and distal feet. Multiple skin wounds have healed up nicely.       Assessment & Plan:  Impression peripheral neuropathy discussed. #2 Suitability to Dr. discussed in form filled out in patient's presence. #3 type 1 diabetes still awaiting referral for some reason. #4 hypothyroidism. Plan encouraged to add 6 units of Lantus. Followup with specialist one scheduled. Form filled out for DMV. WSL

## 2013-10-01 NOTE — Patient Instructions (Signed)
Add six more units to lantus regimen

## 2013-10-02 NOTE — Addendum Note (Signed)
Addended by: Donna BernardLUKING, STEPHEN W on: 10/02/2013 11:45 AM   Modules accepted: Orders

## 2013-10-03 ENCOUNTER — Telehealth: Payer: Self-pay | Admitting: Family Medicine

## 2013-10-03 NOTE — Telephone Encounter (Signed)
Not surprised.  

## 2013-10-03 NOTE — Telephone Encounter (Signed)
Dr. Isidoro DonningNida's office stated that they've left several messages and pt's not returned their call.  I called & spoke with pt, gave pt Dr. Isidoro DonningNida's phone number, pt states he will call to schedule his appointment

## 2013-12-24 ENCOUNTER — Encounter: Payer: Self-pay | Admitting: Family Medicine

## 2013-12-24 ENCOUNTER — Ambulatory Visit (INDEPENDENT_AMBULATORY_CARE_PROVIDER_SITE_OTHER): Payer: BC Managed Care – PPO | Admitting: Family Medicine

## 2013-12-24 VITALS — BP 120/82 | Temp 98.6°F | Ht 73.0 in | Wt 169.0 lb

## 2013-12-24 DIAGNOSIS — J019 Acute sinusitis, unspecified: Secondary | ICD-10-CM

## 2013-12-24 DIAGNOSIS — E119 Type 2 diabetes mellitus without complications: Secondary | ICD-10-CM

## 2013-12-24 MED ORDER — LEVOFLOXACIN 500 MG PO TABS: 500.0000 mg | ORAL_TABLET | Freq: Every day | ORAL | Status: DC

## 2013-12-24 NOTE — Progress Notes (Signed)
   Subjective:    Patient ID: Chase Benson, male    DOB: 12-Jul-1991, 23 y.o.   MRN: [id number]  Cough This is a new problem. The current episode started yesterday. Associated symptoms include a fever, headaches, myalgias, nasal congestion and wheezing. Treatments tried: advil and aleve cold and sinus. The treatment provided no relief.   Has a history diabetes sugar levels are running in the 200 range   Review of Systems  Constitutional: Positive for fever.  Respiratory: Positive for cough and wheezing.   Musculoskeletal: Positive for myalgias.  Neurological: Positive for headaches.       Objective:   Physical Exam Lungs are clear deep cough noted heart regular some wheezing wheezing or difficulty breathing. Skin warm dry       Assessment & Plan:  Upper respiratory illness probable viral Acute bronchitis possible secondary bacterial infection Reactive airway not severe enough to be on prednisone.  Treatment measures discussed warning signs discussed followup if problems  Patient warned to keep sugars in the 100 region. He will be following up with Dr. Fransico Him

## 2014-01-21 ENCOUNTER — Emergency Department (HOSPITAL_COMMUNITY): Payer: BC Managed Care – PPO

## 2014-01-21 ENCOUNTER — Emergency Department (HOSPITAL_COMMUNITY)
Admission: EM | Admit: 2014-01-21 | Discharge: 2014-01-21 | Disposition: A | Discharge status: Home / Self Care | Payer: BC Managed Care – PPO | Attending: Emergency Medicine | Admitting: Emergency Medicine

## 2014-01-21 ENCOUNTER — Encounter (HOSPITAL_COMMUNITY): Payer: Self-pay | Admitting: Emergency Medicine

## 2014-01-21 DIAGNOSIS — E079 Disorder of thyroid, unspecified: Secondary | ICD-10-CM | POA: Insufficient documentation

## 2014-01-21 DIAGNOSIS — Z794 Long term (current) use of insulin: Secondary | ICD-10-CM | POA: Insufficient documentation

## 2014-01-21 DIAGNOSIS — S62309A Unspecified fracture of unspecified metacarpal bone, initial encounter for closed fracture: Secondary | ICD-10-CM | POA: Insufficient documentation

## 2014-01-21 DIAGNOSIS — Z792 Long term (current) use of antibiotics: Secondary | ICD-10-CM | POA: Insufficient documentation

## 2014-01-21 DIAGNOSIS — E109 Type 1 diabetes mellitus without complications: Secondary | ICD-10-CM | POA: Insufficient documentation

## 2014-01-21 DIAGNOSIS — F17200 Nicotine dependence, unspecified, uncomplicated: Secondary | ICD-10-CM | POA: Insufficient documentation

## 2014-01-21 DIAGNOSIS — S6292XA Unspecified fracture of left wrist and hand, initial encounter for closed fracture: Secondary | ICD-10-CM

## 2014-01-21 DIAGNOSIS — Z8659 Personal history of other mental and behavioral disorders: Secondary | ICD-10-CM | POA: Insufficient documentation

## 2014-01-21 DIAGNOSIS — Z79899 Other long term (current) drug therapy: Secondary | ICD-10-CM | POA: Insufficient documentation

## 2014-01-21 DIAGNOSIS — IMO0002 Reserved for concepts with insufficient information to code with codable children: Secondary | ICD-10-CM | POA: Insufficient documentation

## 2014-01-21 DIAGNOSIS — Y9289 Other specified places as the place of occurrence of the external cause: Secondary | ICD-10-CM | POA: Insufficient documentation

## 2014-01-21 DIAGNOSIS — Y9389 Activity, other specified: Secondary | ICD-10-CM | POA: Insufficient documentation

## 2014-01-21 MED ORDER — OXYCODONE-ACETAMINOPHEN 5-325 MG PO TABS
1.0000 | ORAL_TABLET | Freq: Once | ORAL | Status: AC
  Administered 2014-01-21: 1 via ORAL

## 2014-01-21 MED ORDER — OXYCODONE-ACETAMINOPHEN 5-325 MG PO TABS
ORAL_TABLET | ORAL | Status: AC
  Filled 2014-01-21: qty 1

## 2014-01-21 MED ORDER — OXYCODONE-ACETAMINOPHEN 5-325 MG PO TABS: 1.0000 | ORAL_TABLET | Freq: Four times a day (QID) | ORAL | Status: DC | PRN

## 2014-01-21 MED ORDER — NAPROXEN 500 MG PO TABS: 500.0000 mg | ORAL_TABLET | Freq: Two times a day (BID) | ORAL | Status: DC

## 2014-01-21 NOTE — ED Notes (Signed)
Pt c/o left hand pain and swelling. Pt states he punched a wall last night.

## 2014-01-21 NOTE — ED Provider Notes (Signed)
CSN: [id number]     Arrival date & time 01/21/14  0902 History   First MD Initiated Contact with Patient 01/21/14 [phone]     Chief Complaint  Patient presents with  . Hand Injury     (Consider location/radiation/quality/duration/timing/severity/associated sxs/prior Treatment) Patient is a 23 y.o. male presenting with hand injury. The history is provided by the patient.  Hand Injury Location:  Hand Time since incident:  12 hours Injury: yes   Hand location:  L hand Pain details:    Quality:  Sharp and shooting   Radiates to:  L wrist   Severity:  Severe   Onset quality:  Sudden   Timing:  Constant   Progression:  Worsening Chronicity:  New Handedness:  Right-handed Dislocation: no   Foreign body present:  No foreign bodies Tetanus status:  Up to date Prior injury to area:  No Relieved by:  Nothing Worsened by:  Movement Ineffective treatments:  Acetaminophen, NSAIDs and ice Associated symptoms: swelling and tingling    Gerome ApleyBenjamin L Heick is a 23 y.o. male who presents to the ED with left hand and wrist pain that started last night after he got angry and punched a wall. He hit an area that had wood behind it so the sheet rock did not give. He complains of pain and swelling to the left hand and abrasions to the dorsum of the hand.  Past Medical History  Diagnosis Date  . ADHD (attention deficit hyperactivity disorder)   . Diabetes mellitus     Type 1  . Thyroid disease   . Allergy    History reviewed. No pertinent past surgical history. History reviewed. No pertinent family history. History  Substance Use Topics  . Smoking status: Current Every Day Smoker  . Smokeless tobacco: Never Used  . Alcohol Use: Yes    Review of Systems Negative except as stated in HPI   Allergies  Sulfa antibiotics  Home Medications   Prior to Admission medications   Medication Sig Start Date End Date Taking? Authorizing Provider  Aspirin-Salicylamide-Caffeine (BC HEADACHE) 325-95-16 MG  TABS Take 2-3 packets by mouth 3 (three) times daily as needed. For pain    Historical Provider, MD  gabapentin (NEURONTIN) 100 MG capsule Take 1 capsule (100 mg total) by mouth at bedtime. 08/13/13   Merlyn AlbertWilliam S Luking, MD  insulin glargine (LANTUS) 100 UNIT/ML injection Inject 0.4 mLs (40 Units total) into the skin at bedtime. 08/13/13 08/13/14  Merlyn AlbertWilliam S Luking, MD  insulin regular (NOVOLIN R,HUMULIN R) 100 units/mL injection Inject 0.5-0.75 mL (50-75 Units total) into the skin 3 (three) times daily before meals. As directed per sliding scale. 03/05/13   Merlyn AlbertWilliam S Luking, MD  levofloxacin (LEVAQUIN) 500 MG tablet Take 1 tablet (500 mg total) by mouth daily. 12/24/13   Babs SciaraScott A Luking, MD  levothyroxine (SYNTHROID, LEVOTHROID) 100 MCG tablet Take a 1/2 tab daily 06/19/13   Merlyn AlbertWilliam S Luking, MD  mupirocin cream (BACTROBAN) 2 % Apply 1 application topically 2 (two) times daily. 10/01/13   Merlyn AlbertWilliam S Luking, MD   BP 125/81  Pulse 74  Temp(Src) 98.2 F (36.8 C) (Oral)  Resp 20  SpO2 100% Physical Exam  Nursing note and vitals reviewed. Constitutional: He is oriented to person, place, and time. He appears well-developed and well-nourished. No distress.  HENT:  Head: Normocephalic.  Eyes: EOM are normal.  Neck: Neck supple.  Cardiovascular: Normal rate.   Pulmonary/Chest: Effort normal.  Abdominal: Soft. There is no tenderness.  Musculoskeletal:  Left hand: He exhibits decreased range of motion, tenderness, bony tenderness, laceration and swelling. He exhibits normal capillary refill and no deformity. Normal sensation noted. Decreased strength: due to pain.       Hands: Swelling noted to the dorsum of the left hand and ecchymosis to the base of the index finger. Multiple abrasions noted. Radial pulse strong, adequate circulation, good touch sensation. Tender with palpation. Decreased range of motion.   Neurological: He is alert and oriented to person, place, and time. No cranial nerve deficit.    Skin: Skin is warm and dry.  Psychiatric: He has a normal mood and affect. His behavior is normal.    ED Course: discussed with Dr. Adriana Simasook  Procedures Dg Wrist Complete Left  01/21/2014   CLINICAL DATA:  Second metacarpal fracture, injury, pain  EXAM: LEFT WRIST - COMPLETE 3+ VIEW  COMPARISON:  01/21/2014  FINDINGS: There is re- demonstration of a minimally displaced spiral type fracture of the left second metacarpal shaft. No acute osseous finding at the wrist. The distal radius, ulna and carpal bones appear intact.  IMPRESSION: Acute displaced left second metacarpal fracture   Electronically Signed   By: Ruel Favorsrevor  Shick M.D.   On: 01/21/2014 09:41   Dg Hand Complete Left  01/21/2014   CLINICAL DATA:  Injury, pain and swelling  EXAM: LEFT HAND - COMPLETE 3+ VIEW  COMPARISON:  01/21/2014  FINDINGS: There is an acute minimally displaced spiral type fracture of the left second metacarpal shaft. The other metacarpals and phalanges appear intact. Preserved joint spaces. No associated subluxation or dislocation.  IMPRESSION: Acute minimally displaced left second metacarpal fracture.   Electronically Signed   By: Ruel Favorsrevor  Shick M.D.   On: 01/21/2014 09:39   MDM  23 y.o. male with abrasions and fracture to the left hand s/p hitting a wall with his fist last night. Placed in radial gutter splint, ice, elevation and pain management. Stable for discharge without neurovascular deficits. He will follow up with Dr. Hilda LiasKeeling. He will return here as needed for problems.    Medication List    TAKE these medications       naproxen 500 MG tablet  Commonly known as:  NAPROSYN  Take 1 tablet (500 mg total) by mouth 2 (two) times daily.     oxyCODONE-acetaminophen 5-325 MG per tablet  Commonly known as:  ROXICET  Take 1 tablet by mouth every 6 (six) hours as needed for severe pain.      ASK your doctor about these medications       BC HEADACHE 325-95-16 MG Tabs  Generic drug:  Aspirin-Salicylamide-Caffeine   Take 2-3 packets by mouth 3 (three) times daily as needed. For pain     gabapentin 100 MG capsule  Commonly known as:  NEURONTIN  Take 1 capsule (100 mg total) by mouth at bedtime.     insulin glargine 100 UNIT/ML injection  Commonly known as:  LANTUS  Inject 46 Units into the skin at bedtime.     insulin regular 100 units/mL injection  Commonly known as:  NOVOLIN R,HUMULIN R  Inject 0.5-0.75 mL (50-75 Units total) into the skin 3 (three) times daily before meals. As directed per sliding scale.     levothyroxine 100 MCG tablet  Commonly known as:  SYNTHROID, LEVOTHROID  Take 100 mcg by mouth daily before breakfast.           Janne NapoleonHope M Neese, NP 01/21/14 1828

## 2014-01-21 NOTE — Discharge Instructions (Signed)
Call Dr. Sanjuan DameKeeling's office today for a follow up appointment . Do not take the narcotic pain medication if you are driving as it will make you sleepy. Elevate the area, apply ice. Return if you develop any problems such as numbness of the fingers, feeling cold or turning blue.

## 2014-01-23 NOTE — ED Provider Notes (Signed)
Medical screening examination/treatment/procedure(s) were conducted as a shared visit with non-physician practitioner(s) and myself.  I personally evaluated the patient during the encounter.   EKG Interpretation None     X-rays of left hand show an acute minimally displaced left second metacarpal fracture. Will put in a radial gutter splint and referred to orthopedics  Donnetta HutchingBrian Nitya Cauthon, MD 01/23/14 [phone]

## 2014-02-04 ENCOUNTER — Telehealth: Payer: Self-pay | Admitting: Family Medicine

## 2014-02-04 MED ORDER — INSULIN GLARGINE 100 UNIT/ML ~~LOC~~ SOLN: 46.0000 [IU] | Freq: Every day | SUBCUTANEOUS | Status: DC

## 2014-02-04 MED ORDER — INSULIN REGULAR HUMAN 100 UNIT/ML IJ SOLN: 50.0000 [IU] | Freq: Three times a day (TID) | INTRAMUSCULAR | Status: DC

## 2014-02-04 NOTE — Telephone Encounter (Signed)
Patient notified

## 2014-02-04 NOTE — Telephone Encounter (Signed)
insulin glargine (LANTUS) 100 UNIT/ML injection  insulin regular (NOVOLIN R,HUMULIN R) 100 units/mL injection  Pt states he has been trying via [facility] to [facility] these scripts, he is  Going to [place] in the am and needs these refills ASAP if we can   [facility]   No hard faxes have come through or via escript to us from what we can see

## 2014-02-04 NOTE — Telephone Encounter (Signed)
Enid DerryBrendale, can you please check on the Dr. Isidoro DonningNida's referral. Thanks!

## 2014-02-04 NOTE — Telephone Encounter (Signed)
Ok times one, brendale to ck on status of referral

## 2014-02-04 NOTE — Telephone Encounter (Signed)
Pt was last seen for a sick visit and a diabetic check up on 6/9 with Dr. Lorin PicketScott. I see there is a referral for Dr. Fransico HimNida that was put in on 6/10, but he never got a call from us or Dr. Isidoro DonningNida's office.

## 2014-02-05 ENCOUNTER — Encounter: Payer: Self-pay | Admitting: Family Medicine

## 2014-02-05 NOTE — Telephone Encounter (Signed)
Referral was faxed 09/15/13, pt never returned their call to schedule Referral was faxed 12/25/13, [facility] with no return call yet again Appointment was scheduled for 04/01/14 @ 9:30, I will call pt and mail letter to notify, not sure he will show up

## 2014-04-20 ENCOUNTER — Encounter (HOSPITAL_COMMUNITY): Payer: Self-pay | Admitting: Emergency Medicine

## 2014-04-20 ENCOUNTER — Emergency Department (HOSPITAL_COMMUNITY): Payer: BC Managed Care – PPO

## 2014-04-20 ENCOUNTER — Emergency Department (HOSPITAL_COMMUNITY)
Admission: EM | Admit: 2014-04-20 | Discharge: 2014-04-20 | Disposition: A | Discharge status: Home / Self Care | Payer: BC Managed Care – PPO | Attending: Emergency Medicine | Admitting: Emergency Medicine

## 2014-04-20 DIAGNOSIS — E079 Disorder of thyroid, unspecified: Secondary | ICD-10-CM | POA: Insufficient documentation

## 2014-04-20 DIAGNOSIS — Y9389 Activity, other specified: Secondary | ICD-10-CM | POA: Insufficient documentation

## 2014-04-20 DIAGNOSIS — S9030XA Contusion of unspecified foot, initial encounter: Secondary | ICD-10-CM | POA: Diagnosis not present

## 2014-04-20 DIAGNOSIS — X58XXXA Exposure to other specified factors, initial encounter: Secondary | ICD-10-CM | POA: Diagnosis not present

## 2014-04-20 DIAGNOSIS — E109 Type 1 diabetes mellitus without complications: Secondary | ICD-10-CM | POA: Diagnosis not present

## 2014-04-20 DIAGNOSIS — S82302A Unspecified fracture of lower end of left tibia, initial encounter for closed fracture: Secondary | ICD-10-CM | POA: Insufficient documentation

## 2014-04-20 DIAGNOSIS — Z720 Tobacco use: Secondary | ICD-10-CM | POA: Diagnosis not present

## 2014-04-20 DIAGNOSIS — Z8659 Personal history of other mental and behavioral disorders: Secondary | ICD-10-CM | POA: Diagnosis not present

## 2014-04-20 DIAGNOSIS — Y929 Unspecified place or not applicable: Secondary | ICD-10-CM | POA: Diagnosis not present

## 2014-04-20 DIAGNOSIS — S82892A Other fracture of left lower leg, initial encounter for closed fracture: Secondary | ICD-10-CM

## 2014-04-20 DIAGNOSIS — Z79899 Other long term (current) drug therapy: Secondary | ICD-10-CM | POA: Insufficient documentation

## 2014-04-20 DIAGNOSIS — Z794 Long term (current) use of insulin: Secondary | ICD-10-CM | POA: Diagnosis not present

## 2014-04-20 DIAGNOSIS — S99912A Unspecified injury of left ankle, initial encounter: Secondary | ICD-10-CM | POA: Diagnosis present

## 2014-04-20 MED ORDER — NAPROXEN 500 MG PO TABS: 500.0000 mg | ORAL_TABLET | Freq: Two times a day (BID) | ORAL | Status: DC

## 2014-04-20 MED ORDER — OXYCODONE-ACETAMINOPHEN 5-325 MG PO TABS: 1.0000 | ORAL_TABLET | ORAL | Status: DC | PRN

## 2014-04-20 NOTE — Discharge Instructions (Signed)
Ankle Fracture °A fracture is a break in a bone. A cast or splint may be used to protect the ankle and heal the break. Sometimes, surgery is needed. °HOME CARE °· Use crutches as told by your doctor. It is very important that you use your crutches correctly. °· Do not put weight or pressure on the injured ankle until told by your doctor. °· Keep your ankle raised (elevated) when sitting or lying down. °· Apply ice to the ankle: °¨ Put ice in a plastic bag. °¨ Place a towel between your cast and the bag. °¨ Leave the ice on for 20 minutes, 2-3 times a day. °· If you have a plaster or fiberglass cast: °¨ Do not try to scratch under the cast with any objects. °¨ Check the skin around the cast every day. You may put lotion on red or sore areas. °¨ Keep your cast dry and clean. °· If you have a plaster splint: °¨ Wear the splint as told by your doctor. °¨ You can loosen the elastic around the splint if your toes get numb, tingle, or turn cold or blue. °· Do not put pressure on any part of your cast or splint. It may break. Rest your plaster splint or cast only on a pillow the first 24 hours until it is fully hardened. °· Cover your cast or splint with a plastic bag during showers. °· Do not lower your cast or splint into water. °· Take medicine as told by your doctor. °· Do not drive until your doctor says it is safe. °· Follow-up with your doctor as told. It is very important that you go to your follow-up visits. °GET HELP IF: °The swelling and discomfort gets worse.  °GET HELP RIGHT AWAY IF:  °· Your splint or cast breaks. °· You continue to have very bad pain. °· You have new pain or swelling after your splint or cast was put on. °· Your skin or toes below the injured ankle: °¨ Turn blue or gray. °¨ Feel cold, numb, or you cannot feel them. °· There is a bad smell or yellowish white fluid (pus) coming from under the splint or cast. °MAKE SURE YOU:  °· Understand these instructions. °· Will watch your  condition. °· Will get help right away if you are not doing well or get worse. °Document Released: 05/01/2009 Document Revised: 04/24/2013 Document Reviewed: 01/31/2013 °Exit[employer]® Patient Information ©2015 [employer]. This information is not intended to replace advice given to you by your health care provider. Make sure you discuss any questions you have with your health care provider. ° °

## 2014-04-20 NOTE — ED Notes (Signed)
Patient with no complaints at this time. Respirations even and unlabored. Skin warm/dry. Discharge instructions reviewed with patient at this time. Patient given opportunity to voice concerns/ask questions. Patient discharged at this time and left Emergency Department with steady gait.   

## 2014-04-20 NOTE — ED Notes (Signed)
Pt here with left ankle pain since falling in hole.  Pt reports pain and swelling to ankle

## 2014-04-20 NOTE — ED Provider Notes (Signed)
CSN: [id number]     Arrival date & time 04/20/14  1200 History   This chart was scribed for a non-physician practitioner, Maxwell Caul, PAworking with Enid Skeens, MD by [place] Peace, ED Scribe. The patient was seen in APFT23/APFT23. The patient's care was started at 2:17 PM.    Chief Complaint  Patient presents with  . Ankle Pain      Patient is a 23 y.o. male presenting with ankle pain. The history is provided by the patient. No language interpreter was used.  Ankle Pain Location:  Ankle Time since incident:  2 days Ankle location:  L ankle Pain details:    Severity:  Severe   Progression:  Worsening Chronicity:  New Worsened by:  Bearing weight, extension, flexion and rotation Associated symptoms: no fever    HPI Comments: Chase Benson is a 23 y.o. male who presents to the Emergency Department complaining of left ankle pain onset 2 days ago with associated swelling and bruising around the anterior part of his ankle and into his toes. Pt states that he stepped in a hole and inverted his ankle. He states that he tried applying ice to affected area and elevating his foot to address symptoms but denies much relief. He states that pain is exacerbated by weight bearing or applying pressure to that area. Pt also complains of some associated tingling up his leg. He denies hitting his head or LOC. Pt reports that he works at [employer] and is generally on his feet all day and sometimes lifting up to 100 or 200 lbs.   Past Medical History  Diagnosis Date  . ADHD (attention deficit hyperactivity disorder)   . Diabetes mellitus     Type 1  . Thyroid disease   . Allergy    History reviewed. No pertinent past surgical history. No family history on file. History  Substance Use Topics  . Smoking status: Current Every Day Smoker  . Smokeless tobacco: Never Used  . Alcohol Use: Yes    Review of Systems  Constitutional: Negative for fever and chills.  Gastrointestinal:  Negative for nausea and vomiting.  Genitourinary: Negative for dysuria and difficulty urinating.  Musculoskeletal: Positive for arthralgias and joint swelling.       Left ankle pain with associated bruising and swelling.   Skin: Positive for color change (bluish bruising ). Negative for wound.  Neurological: Negative for numbness.  All other systems reviewed and are negative.     Allergies  Sulfa antibiotics  Home Medications   Prior to Admission medications   Medication Sig Start Date End Date Taking? Authorizing Provider  Aspirin-Salicylamide-Caffeine (BC HEADACHE) 325-95-16 MG TABS Take 2-3 packets by mouth 3 (three) times daily as needed. For pain    Historical Provider, MD  gabapentin (NEURONTIN) 100 MG capsule Take 1 capsule (100 mg total) by mouth at bedtime. 08/13/13   Merlyn Albert, MD  HYDROcodone-acetaminophen (NORCO) 7.5-325 MG per tablet Take 1 tablet by mouth every 6 (six) hours as needed. 03/27/14   Historical Provider, MD  insulin glargine (LANTUS) 100 UNIT/ML injection Inject 0.46 mLs (46 Units total) into the skin at bedtime. 02/04/14   Merlyn Albert, MD  insulin regular (NOVOLIN R,HUMULIN R) 100 units/mL injection Inject 0.5-0.75 mLs (50-75 Units total) into the skin 3 (three) times daily before meals. As directed per sliding scale. 02/04/14   Merlyn Albert, MD  levothyroxine (SYNTHROID, LEVOTHROID) 100 MCG tablet Take 100 mcg by mouth daily before breakfast.  Historical Provider, MD  NOVOLOG FLEXPEN 100 UNIT/ML FlexPen  04/09/14   Historical Provider, MD   BP 130/67  Pulse 72  Temp(Src) 97.5 F (36.4 C) (Oral)  Resp 18  SpO2 100% Physical Exam  Nursing note and vitals reviewed. Constitutional: He is oriented to person, place, and time. He appears well-developed and well-nourished. No distress.  HENT:  Head: Normocephalic and atraumatic.  Eyes: Conjunctivae and EOM are normal.  Neck: Neck supple. No tracheal deviation present.  Cardiovascular: Normal  rate, regular rhythm and intact distal pulses.  Exam reveals no gallop and no friction rub.   No murmur heard. Pulmonary/Chest: Effort normal and breath sounds normal. No respiratory distress.  Musculoskeletal: Normal range of motion. He exhibits tenderness.  Moderate diffuse soft tissue swelling and ecchymosis of the left lateral foot and ankle. DP pulse and distal sensation intact. No proximal tenderness. Compartments of the left leg are soft.   Neurological: He is alert and oriented to person, place, and time.  Skin: Skin is warm and dry.  Psychiatric: He has a normal mood and affect. His behavior is normal.    ED Course  Procedures (including critical care time) Labs Review Labs Reviewed - No data to display   Imaging Review Dg Ankle Complete Left  04/20/2014   CLINICAL DATA:  Pain swelling bruising involving the left ankle below both malleoli after stepping in a [place] and [place] foot 3 [place]  EXAM: LEFT ANKLE COMPLETE - 3+ VIEW  COMPARISON:  None.  FINDINGS: Mild diffuse soft tissue swelling around the ankle. Mortise is intact. Tiny ossicle seen inferior to the lateral malleolus, not of acute significance. Small ankle joint effusion.  On the lateral view, there is evidence of a talar neck avulsion fracture with fragment measuring approximately 8 mm. There is also evidence of a possible similar tiny avulsion fracture off of the anterior distal tibia.  IMPRESSION: Avulsion fractures, acute, involving anterior distal tibial epiphysis and dorsal aspect of the talar neck.   Electronically Signed   By: Esperanza Heiraymond  Rubner M.D.   On: 04/20/2014 13:11   Dg Foot Complete Left  04/20/2014   CLINICAL DATA:  Stepped in hole and everted foot 3 days ago while shopping now with pain and bruising involving the proximal phalanx of the second through fourth toes. Initial encounter  EXAM: LEFT FOOT - COMPLETE 3+ VIEW  COMPARISON:  Left ankle radiographs -  earlier same day  FINDINGS: No fracture or dislocation. Joint spaces are preserved. No erosions. Incidental note is made of a small os peroneus. Regional soft tissues appear normal. No radiopaque foreign body.  IMPRESSION: No fracture or dislocation.   Electronically Signed   By: Simonne ComeJohn  Watts M.D.   On: 04/20/2014 13:09     EKG Interpretation None     Medications - No data to display  2:23 PM- Treatment plan was discussed with patient who verbalizes understanding and agrees.   MDM   Final diagnoses:  Ankle fracture, left, closed, initial encounter    Cam walker applied, pain improved, remains NV intact, compartments soft  Pt agrees to orthopedic f/u and rx written for #15 percocet and naprosyn for pain.    I personally performed the services described in this documentation, which was scribed in my presence. The recorded information has been reviewed and is accurate.   Jayvyn Haselton L. Trisha Mangleriplett, PA-C 04/21/14 2104

## 2014-04-23 NOTE — ED Provider Notes (Signed)
Medical screening examination/treatment/procedure(s) were performed by non-physician practitioner and as supervising physician I was immediately available for consultation/collaboration.   EKG Interpretation None        Chase SkeensJoshua M Sommer Spickard, MD 04/23/14 1616

## 2014-05-05 ENCOUNTER — Telehealth: Payer: Self-pay | Admitting: Family Medicine

## 2014-05-05 MED ORDER — PERMETHRIN 5 % EX CREA: 1.0000 "application " | TOPICAL_CREAM | Freq: Once | CUTANEOUS | Status: DC

## 2014-05-05 NOTE — Telephone Encounter (Signed)
elimite cr as dir

## 2014-05-05 NOTE — Telephone Encounter (Signed)
Rx sent electronically to pharmacy. Patient notified. 

## 2014-05-05 NOTE — Telephone Encounter (Signed)
Patients son was recently diagnosed with scabies by our office and he believes he has it now.  He said its pretty much everywhere on his body and would like something called in.  [place] [facility]

## 2014-10-07 ENCOUNTER — Other Ambulatory Visit (HOSPITAL_COMMUNITY): Payer: Self-pay | Admitting: Orthopaedic Surgery

## 2014-10-07 DIAGNOSIS — M25562 Pain in left knee: Secondary | ICD-10-CM

## 2014-10-15 ENCOUNTER — Ambulatory Visit (HOSPITAL_COMMUNITY)
Admission: RE | Admit: 2014-10-15 | Discharge: 2014-10-15 | Disposition: A | Discharge status: Home / Self Care | Payer: BLUE CROSS/BLUE SHIELD | Source: Ambulatory Visit | Attending: Orthopaedic Surgery | Admitting: Orthopaedic Surgery

## 2014-10-15 DIAGNOSIS — G8929 Other chronic pain: Secondary | ICD-10-CM | POA: Diagnosis not present

## 2014-10-15 DIAGNOSIS — M25562 Pain in left knee: Secondary | ICD-10-CM | POA: Insufficient documentation

## 2014-10-17 ENCOUNTER — Encounter (HOSPITAL_COMMUNITY): Payer: Self-pay | Admitting: *Deleted

## 2014-10-17 ENCOUNTER — Emergency Department (HOSPITAL_COMMUNITY): Payer: BLUE CROSS/BLUE SHIELD

## 2014-10-17 ENCOUNTER — Emergency Department (HOSPITAL_COMMUNITY)
Admission: EM | Admit: 2014-10-17 | Discharge: 2014-10-17 | Disposition: A | Discharge status: Home / Self Care | Payer: BLUE CROSS/BLUE SHIELD | Attending: Emergency Medicine | Admitting: Emergency Medicine

## 2014-10-17 DIAGNOSIS — B349 Viral infection, unspecified: Secondary | ICD-10-CM | POA: Insufficient documentation

## 2014-10-17 DIAGNOSIS — Z794 Long term (current) use of insulin: Secondary | ICD-10-CM | POA: Diagnosis not present

## 2014-10-17 DIAGNOSIS — E079 Disorder of thyroid, unspecified: Secondary | ICD-10-CM | POA: Insufficient documentation

## 2014-10-17 DIAGNOSIS — R531 Weakness: Secondary | ICD-10-CM

## 2014-10-17 DIAGNOSIS — Z720 Tobacco use: Secondary | ICD-10-CM | POA: Diagnosis not present

## 2014-10-17 DIAGNOSIS — Z8659 Personal history of other mental and behavioral disorders: Secondary | ICD-10-CM | POA: Diagnosis not present

## 2014-10-17 DIAGNOSIS — E109 Type 1 diabetes mellitus without complications: Secondary | ICD-10-CM | POA: Diagnosis not present

## 2014-10-17 DIAGNOSIS — R52 Pain, unspecified: Secondary | ICD-10-CM | POA: Diagnosis present

## 2014-10-17 LAB — COMPREHENSIVE METABOLIC PANEL
ALT: 19 U/L (ref 0–53)
AST: 32 U/L (ref 0–37)
Albumin: 3.8 g/dL (ref 3.5–5.2)
Alkaline Phosphatase: 91 U/L (ref 39–117)
Anion gap: 8 (ref 5–15)
BILIRUBIN TOTAL: 0.4 mg/dL (ref 0.3–1.2)
BUN: 10 mg/dL (ref 6–23)
CO2: 27 mmol/L (ref 19–32)
CREATININE: 0.6 mg/dL (ref 0.50–1.35)
Calcium: 8.9 mg/dL (ref 8.4–10.5)
Chloride: 106 mmol/L (ref 96–112)
GLUCOSE: 76 mg/dL (ref 70–99)
Potassium: 3.8 mmol/L (ref 3.5–5.1)
Sodium: 141 mmol/L (ref 135–145)
Total Protein: 6.9 g/dL (ref 6.0–8.3)

## 2014-10-17 LAB — CBC WITH DIFFERENTIAL/PLATELET
BASOS ABS: 0 10*3/uL (ref 0.0–0.1)
Basophils Relative: 0 % (ref 0–1)
EOS ABS: 0.1 10*3/uL (ref 0.0–0.7)
Eosinophils Relative: 1 % (ref 0–5)
HCT: 43.1 % (ref 39.0–52.0)
Hemoglobin: 14.1 g/dL (ref 13.0–17.0)
Lymphocytes Relative: 13 % (ref 12–46)
Lymphs Abs: 1.3 10*3/uL (ref 0.7–4.0)
MCH: 31.5 pg (ref 26.0–34.0)
MCHC: 32.7 g/dL (ref 30.0–36.0)
MCV: 96.2 fL (ref 78.0–100.0)
Monocytes Absolute: 0.5 10*3/uL (ref 0.1–1.0)
Monocytes Relative: 5 % (ref 3–12)
Neutro Abs: 8.1 10*3/uL — ABNORMAL HIGH (ref 1.7–7.7)
Neutrophils Relative %: 81 % — ABNORMAL HIGH (ref 43–77)
Platelets: 262 10*3/uL (ref 150–400)
RBC: 4.48 MIL/uL (ref 4.22–5.81)
RDW: 12.4 % (ref 11.5–15.5)
WBC: 10 10*3/uL (ref 4.0–10.5)

## 2014-10-17 LAB — URINALYSIS, ROUTINE W REFLEX MICROSCOPIC
Bilirubin Urine: NEGATIVE
GLUCOSE, UA: 500 mg/dL — AB
Hgb urine dipstick: NEGATIVE
Ketones, ur: NEGATIVE mg/dL
LEUKOCYTES UA: NEGATIVE
NITRITE: NEGATIVE
PROTEIN: NEGATIVE mg/dL
Specific Gravity, Urine: 1.015 (ref 1.005–1.030)
Urobilinogen, UA: 0.2 mg/dL (ref 0.0–1.0)
pH: 5.5 (ref 5.0–8.0)

## 2014-10-17 LAB — CBG MONITORING, ED: Glucose-Capillary: 78 mg/dL (ref 70–99)

## 2014-10-17 MED ORDER — SODIUM CHLORIDE 0.9 % IV BOLUS (SEPSIS)
1000.0000 mL | Freq: Once | INTRAVENOUS | Status: AC
  Administered 2014-10-17: 1000 mL via INTRAVENOUS

## 2014-10-17 NOTE — Discharge Instructions (Signed)
Increase fluids. Tylenol for fever. Rest. Return if worse.

## 2014-10-17 NOTE — ED Provider Notes (Signed)
CSN: [id number]     Arrival date & time 10/17/14  0921 History  This chart was scribed for Chase Hutching, MD by Elveria Rising, ED scribe.  This patient was seen in room [facility] and the patient's care was started at 9:31 AM.   Chief Complaint  Patient presents with  . Generalized Body Aches   The history is provided by the patient. No language interpreter was used.   HPI Comments: Chase Benson is a 24 y.o. male with PMHx of Diabetes Type I and thyroid disease brought in by ambulance, who presents to the Emergency Department complaining of generalized myalgias and weakness  upon waking up this morning. Patient reports that his parents keep calling for him to wake up, but he was unable to roll over or even get out of bed. Per nursing notes patient reports that he was unable to move his legs, stating his knees were locked in place. Patient reports that after waking, it took him 20 minutes to muster the strenth to stand. Upon standing patient reports falling onto his face. Patient reports chills this morning; patient reports dramatic uncontrolled extension and flexion in his arms and legs when attempting to cover himself with his blanket. Patient reports these jerking motions even after getting out of bed. Patient also reports altered speech and states that "he couldn't talk, but he could process things" which was reassuring.  Patient denies recent cold symptoms and states that he felt normal yesterday and when going to bed last night.  Patient reports that his blood sugar typically measures at 150; patient reports diagnosis of Diabetes at the age of 22. Patient is an admitted smoker: pack a day.  Patient employed as a Psychiatric nurse.    Past Medical History  Diagnosis Date  . ADHD (attention deficit hyperactivity disorder)   . Diabetes mellitus     Type 1  . Thyroid disease   . Allergy    History reviewed. No pertinent past surgical history. No family history on file. History  Substance Use  Topics  . Smoking status: Current Every Day Smoker    Types: Cigarettes  . Smokeless tobacco: Never Used  . Alcohol Use: Yes    Review of Systems A complete 10 system review of systems was obtained and all systems are negative except as noted in the HPI and PMH.   Allergies  Sulfa antibiotics  Home Medications   Prior to Admission medications   Medication Sig Start Date End Date Taking? Authorizing Provider  HYDROcodone-acetaminophen (NORCO) 7.5-325 MG per tablet Take 1 tablet by mouth every 6 (six) hours as needed for moderate pain.   Yes Historical Provider, MD  insulin glargine (LANTUS) 100 UNIT/ML injection Inject 30 Units into the skin at bedtime.   Yes Historical Provider, MD  levothyroxine (SYNTHROID, LEVOTHROID) 100 MCG tablet Take 100 mcg by mouth daily before breakfast.   Yes Historical Provider, MD  NOVOLOG FLEXPEN 100 UNIT/ML FlexPen Inject 15 Units into the skin 3 (three) times daily with meals. SLIDING SCALE 04/09/14  Yes Historical Provider, MD  naproxen (NAPROSYN) 500 MG tablet Take 1 tablet (500 mg total) by mouth 2 (two) times daily. With food Patient not taking: Reported on 10/17/2014 04/20/14   Tammi Triplett, PA-C  oxyCODONE-acetaminophen (PERCOCET/ROXICET) 5-325 MG per tablet Take 1 tablet by mouth every 4 (four) hours as needed. Patient not taking: Reported on 10/17/2014 04/20/14   Tammi Triplett, PA-C  permethrin (ELIMITE) 5 % cream Apply 1 application topically once. Patient not taking:  Reported on 10/17/2014 05/05/14   Merlyn AlbertWilliam S Luking, MD   Triage Vitals: BP 129/90 mmHg  Pulse 76  Temp(Src) 98.4 F (36.9 C)  Resp 18  Ht 6' (1.829 m)  Wt 184 lb (83.462 kg)  BMI 24.95 kg/m2  SpO2 100% Physical Exam  Constitutional: He is oriented to person, place, and time. He appears well-developed and well-nourished.  HENT:  Head: Normocephalic and atraumatic.  Eyes: Conjunctivae and EOM are normal. Pupils are equal, round, and reactive to light.  Neck: Normal range of  motion. Neck supple.  Cardiovascular: Normal rate and regular rhythm.   Pulmonary/Chest: Effort normal and breath sounds normal.  Abdominal: Soft. Bowel sounds are normal.  Musculoskeletal: Normal range of motion.  Neurological: He is alert and oriented to person, place, and time.  Skin: Skin is warm and dry.  Psychiatric: He has a normal mood and affect. His behavior is normal.  Nursing note and vitals reviewed.   ED Course  Procedures (including critical care time)  COORDINATION OF CARE: 9:37 AM- Plans to administer IV fluids and obtain basic blood work. Discussed treatment plan with patient at bedside and patient agreed to plan. Patient requests breakfast.   Labs Review Labs Reviewed  CBC WITH DIFFERENTIAL/PLATELET - Abnormal; Notable for the following:    Neutrophils Relative % 81 (*)    Neutro Abs 8.1 (*)    All other components within normal limits  URINALYSIS, ROUTINE W REFLEX MICROSCOPIC - Abnormal; Notable for the following:    Glucose, UA 500 (*)    All other components within normal limits  COMPREHENSIVE METABOLIC PANEL  CBG MONITORING, ED    Imaging Review Dg Chest 2 View  10/17/2014   CLINICAL DATA:  Weakness.  EXAM: CHEST  2 VIEW  COMPARISON:  Chest x-ray dated 11/11/2003  FINDINGS: The heart size and mediastinal contours are within normal limits. Both lungs are clear. The visualized skeletal structures are unremarkable.  IMPRESSION: Normal exam.   Electronically Signed   By: Francene BoyersJames  Maxwell M.D.   On: 10/17/2014 10:19     EKG Interpretation None      MDM   Final diagnoses:  Weakness  Viral syndrome  Insulin dependent type 1 diabetes mellitus    Patient is nontoxic-appearing. He is not in DKA.  He feels better after IV fluids. Vital signs are stable. Suspect viral syndrome  I personally performed the services described in this documentation, which was scribed in my presence. The recorded information has been reviewed and is accurate.    Chase HutchingBrian Yashica Sterbenz,  MD 10/17/14 [phone]

## 2014-10-17 NOTE — ED Notes (Addendum)
Pt woke up this morning not feeling well. Headache, generalized weakness, states he was unable to move his legs this morning, stating his knees were locked in place. Denies N/V/D at this time. Pt states his hands and feet are numb. Pt is diabeteic with CBG of 80 en route. States all over muscle aches. Pt also urinated on himself because he was unable to get to the restroom this morning.

## 2014-10-17 NOTE — ED Notes (Signed)
Pt given food a food tray. Pt talking on phone at present. Family at bedside

## 2014-10-31 ENCOUNTER — Encounter: Payer: Self-pay | Admitting: Family Medicine

## 2014-10-31 ENCOUNTER — Ambulatory Visit (INDEPENDENT_AMBULATORY_CARE_PROVIDER_SITE_OTHER): Payer: BLUE CROSS/BLUE SHIELD | Admitting: Family Medicine

## 2014-10-31 VITALS — BP 130/78 | Temp 97.7°F | Ht 73.0 in | Wt 182.5 lb

## 2014-10-31 DIAGNOSIS — F900 Attention-deficit hyperactivity disorder, predominantly inattentive type: Secondary | ICD-10-CM | POA: Diagnosis not present

## 2014-10-31 DIAGNOSIS — E039 Hypothyroidism, unspecified: Secondary | ICD-10-CM

## 2014-10-31 DIAGNOSIS — G478 Other sleep disorders: Secondary | ICD-10-CM | POA: Diagnosis not present

## 2014-10-31 DIAGNOSIS — F1910 Other psychoactive substance abuse, uncomplicated: Secondary | ICD-10-CM | POA: Diagnosis not present

## 2014-10-31 MED ORDER — METHYLPHENIDATE HCL ER (OSM) 36 MG PO TBCR: 36.0000 mg | EXTENDED_RELEASE_TABLET | Freq: Every day | ORAL | Status: DC

## 2014-10-31 MED ORDER — LEVOTHYROXINE SODIUM 100 MCG PO TABS: 100.0000 ug | ORAL_TABLET | Freq: Every day | ORAL | Status: DC

## 2014-10-31 MED ORDER — PERMETHRIN 5 % EX CREA: 1.0000 "application " | TOPICAL_CREAM | Freq: Once | CUTANEOUS | Status: DC

## 2014-10-31 NOTE — Progress Notes (Signed)
   Subjective:    Patient ID: Chase Benson, male    DOB: January 03, 1991, 24 y.o.   MRN: [id number]  HPI Patient states that he has been having body aches and muscles locking up since 10/17/14. Patient was seen at [facility] ED on 10/17/14 for this issue.  Patient states that he has a rash in his inner thighs, abdomen and arms. This has been present for about 1 month now. Treatments tried: hydrocortisone, Neosporin, benadryl cream with no relief. Of note has a relative with scabies  Unfortunately continues to take not the best care of his self as far as his diabetes, admits to be in late on follow-up visit.  Was told by his endocrinologist that we should maintain his thyroid medicine so he needs refill  Patient states that he has been having problems with focusing. He states that his employer is threatening to let him go if his performance doesn't improve. This has been present for several months now.   On further history patient awakens. He is completely unable to move. This last for a number of minutes. He is done this several times in the past month. In that up in the emergency room once was told that it was viral in etiology. Review of Systems No headache no chest pain no back pain no abdominal pain no change in bowel habits    Objective:   Physical Exam Alert no acute distress. HEENT normal neck supple. Lungs clear. Heart regular in rhythm. 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 Patient's new job requires more focusing and attention. Has been advised that and less this improves he will lose his job. Of note was on ADHD medicines during school. Plan 35-40 minutes spent on this very complicated presentation. Neurology referral. ADHD meds 2 months. Thyroid medicine refilled. Scabies treatment initiated. Strongly encouraged to get back with  endocrinologist [facility]

## 2014-11-01 ENCOUNTER — Encounter (HOSPITAL_COMMUNITY): Payer: Self-pay | Admitting: *Deleted

## 2014-11-01 ENCOUNTER — Emergency Department (HOSPITAL_COMMUNITY)
Admission: EM | Admit: 2014-11-01 | Discharge: 2014-11-02 | Disposition: A | Discharge status: Home / Self Care | Payer: BLUE CROSS/BLUE SHIELD | Attending: Emergency Medicine | Admitting: Emergency Medicine

## 2014-11-01 DIAGNOSIS — F909 Attention-deficit hyperactivity disorder, unspecified type: Secondary | ICD-10-CM | POA: Diagnosis not present

## 2014-11-01 DIAGNOSIS — Z720 Tobacco use: Secondary | ICD-10-CM | POA: Insufficient documentation

## 2014-11-01 DIAGNOSIS — E162 Hypoglycemia, unspecified: Secondary | ICD-10-CM

## 2014-11-01 DIAGNOSIS — Z79899 Other long term (current) drug therapy: Secondary | ICD-10-CM | POA: Insufficient documentation

## 2014-11-01 DIAGNOSIS — E079 Disorder of thyroid, unspecified: Secondary | ICD-10-CM | POA: Insufficient documentation

## 2014-11-01 DIAGNOSIS — Z794 Long term (current) use of insulin: Secondary | ICD-10-CM | POA: Diagnosis not present

## 2014-11-01 DIAGNOSIS — M25562 Pain in left knee: Secondary | ICD-10-CM | POA: Diagnosis not present

## 2014-11-01 DIAGNOSIS — E11649 Type 2 diabetes mellitus with hypoglycemia without coma: Secondary | ICD-10-CM | POA: Diagnosis not present

## 2014-11-01 DIAGNOSIS — F1110 Opioid abuse, uncomplicated: Secondary | ICD-10-CM | POA: Insufficient documentation

## 2014-11-01 LAB — COMPREHENSIVE METABOLIC PANEL
ALBUMIN: 4.6 g/dL (ref 3.5–5.2)
ALT: 28 U/L (ref 0–53)
ANION GAP: 10 (ref 5–15)
AST: 37 U/L (ref 0–37)
Alkaline Phosphatase: 87 U/L (ref 39–117)
BILIRUBIN TOTAL: 0.4 mg/dL (ref 0.3–1.2)
BUN: 6 mg/dL (ref 6–23)
CALCIUM: 9 mg/dL (ref 8.4–10.5)
CO2: 26 mmol/L (ref 19–32)
CREATININE: 0.68 mg/dL (ref 0.50–1.35)
Chloride: 106 mmol/L (ref 96–112)
GFR calc Af Amer: >90 mL/min (ref >90)
GFR calc non Af Amer: >90 mL/min (ref >90)
Glucose, Bld: 44 mg/dL — CL (ref 70–99)
POTASSIUM: 3 mmol/L — AB (ref 3.5–5.1)
SODIUM: 142 mmol/L (ref 135–145)
TOTAL PROTEIN: 7.5 g/dL (ref 6.0–8.3)

## 2014-11-01 LAB — CBC WITH DIFFERENTIAL/PLATELET
Basophils Absolute: 0 10*3/uL (ref 0.0–0.1)
Basophils Relative: 0 % (ref 0–1)
EOS ABS: 0 10*3/uL (ref 0.0–0.7)
Eosinophils Relative: 0 % (ref 0–5)
HEMATOCRIT: 44.8 % (ref 39.0–52.0)
Hemoglobin: 15 g/dL (ref 13.0–17.0)
LYMPHS ABS: 1 10*3/uL (ref 0.7–4.0)
Lymphocytes Relative: 10 % — ABNORMAL LOW (ref 12–46)
MCH: 32 pg (ref 26.0–34.0)
MCHC: 33.5 g/dL (ref 30.0–36.0)
MCV: 95.5 fL (ref 78.0–100.0)
Monocytes Absolute: 0.5 10*3/uL (ref 0.1–1.0)
Monocytes Relative: 5 % (ref 3–12)
Neutro Abs: 9 10*3/uL — ABNORMAL HIGH (ref 1.7–7.7)
Neutrophils Relative %: 85 % — ABNORMAL HIGH (ref 43–77)
Platelets: 278 10*3/uL (ref 150–400)
RBC: 4.69 MIL/uL (ref 4.22–5.81)
RDW: 12.3 % (ref 11.5–15.5)
WBC: 10.6 10*3/uL — ABNORMAL HIGH (ref 4.0–10.5)

## 2014-11-01 LAB — CBG MONITORING, ED
GLUCOSE-CAPILLARY: 82 mg/dL (ref 70–99)
Glucose-Capillary: 187 mg/dL — ABNORMAL HIGH (ref 70–99)
Glucose-Capillary: 43 mg/dL — CL (ref 70–99)

## 2014-11-01 LAB — ETHANOL: Alcohol, Ethyl (B): <5 mg/dL (ref 0–9)

## 2014-11-01 LAB — ACETAMINOPHEN LEVEL: Acetaminophen (Tylenol), Serum: <10 ug/mL — ABNORMAL LOW (ref 10–30)

## 2014-11-01 MED ORDER — DEXTROSE 50 % IV SOLN
INTRAVENOUS | Status: AC
  Filled 2014-11-01: qty 50

## 2014-11-01 MED ORDER — DEXTROSE 50 % IV SOLN
1.0000 | Freq: Once | INTRAVENOUS | Status: AC
  Administered 2014-11-01: 50 mL via INTRAVENOUS

## 2014-11-01 NOTE — ED Notes (Signed)
MD at bedside. 

## 2014-11-01 NOTE — ED Provider Notes (Signed)
CSN: [id number]     Arrival date & time 11/01/14  1851 History   First MD Initiated Contact with Patient 11/01/14 1900     Chief Complaint  Patient presents with  . Drug Overdose     (Consider location/radiation/quality/duration/timing/severity/associated sxs/prior Treatment) Patient is a 24 y.o. male presenting with Overdose.  Drug Overdose Pertinent negatives include no chest pain.   patient brought in for altered mental status. Reportedly had been having some difficulty moving and had difficulty speaking. Has been abusing opiates. Reportedly took 3 hydrocodone today. This is her normal dose for him. He also has been chewing fentanyl patches and abusing Suboxone. States he has not done this in a few days. States he sold some of his hydrocodone to get the fentanyl patches. States he had the Suboxone because he was trying to come off the opiates. States he does opiates less now than it used to. States he was up to around 2-300 mg of oxycodone a day. Upon arrival to ER he was a little combative with decreased mental status. CBG found to be 40 and his mental status improved somewhat by my evaluation. Reportedly did not eat much today and took his insulin. He denies suicidal thoughts. He denies other drug abuse.  Past Medical History  Diagnosis Date  . ADHD (attention deficit hyperactivity disorder)   . Diabetes mellitus     Type 1  . Thyroid disease   . Allergy    History reviewed. No pertinent past surgical history. No family history on file. History  Substance Use Topics  . Smoking status: Current Every Day Smoker    Types: Cigarettes  . Smokeless tobacco: Never Used  . Alcohol Use: Yes    Review of Systems  Constitutional: Positive for activity change. Negative for appetite change.  Eyes: Negative for pain.  Respiratory: Negative for chest tightness.   Cardiovascular: Negative for chest pain.  Gastrointestinal: Negative for abdominal distention.  Genitourinary: Negative for  frequency.  Musculoskeletal: Negative for back pain.       Left knee pain.  Skin: Negative for wound.  Neurological: Negative for light-headedness.      Allergies  Sulfa antibiotics  Home Medications   Prior to Admission medications   Medication Sig Start Date End Date Taking? Authorizing Provider  insulin glargine (LANTUS) 100 UNIT/ML injection Inject 30 Units into the skin at bedtime.    Historical Provider, MD  levothyroxine (SYNTHROID, LEVOTHROID) 100 MCG tablet Take 1 tablet (100 mcg total) by mouth daily before breakfast. 10/31/14   Merlyn Albert, MD  methylphenidate (CONCERTA) 36 MG PO CR tablet Take 1 tablet (36 mg total) by mouth daily. 10/31/14   Merlyn Albert, MD  NOVOLOG FLEXPEN 100 UNIT/ML FlexPen Inject 15 Units into the skin 3 (three) times daily with meals. SLIDING SCALE 04/09/14   Historical Provider, MD  permethrin (ELIMITE) 5 % cream Apply 1 application topically once. 10/31/14   Merlyn Albert, MD   BP 141/76 mmHg  Pulse 77  Temp(Src) 98 F (36.7 C) (Oral)  Resp 16  Ht 6' (1.829 m)  Wt 180 lb (81.647 kg)  BMI 24.41 kg/m2  SpO2 100% Physical Exam  Constitutional: He is oriented to person, place, and time. He appears well-developed and well-nourished.  HENT:  Head: Atraumatic.  Cardiovascular: Normal rate and regular rhythm.   Pulmonary/Chest: Effort normal.  Abdominal: Soft.  Musculoskeletal: Normal range of motion.  Neurological: He is alert and oriented to person, place, and time.  Skin: Skin is  warm.  Psychiatric: He has a normal mood and affect.    ED Course  Procedures (including critical care time) Labs Review Labs Reviewed  CBC WITH DIFFERENTIAL/PLATELET - Abnormal; Notable for the following:    WBC 10.6 (*)    Neutrophils Relative % 85 (*)    Neutro Abs 9.0 (*)    Lymphocytes Relative 10 (*)    All other components within normal limits  CBG MONITORING, ED - Abnormal; Notable for the following:    Glucose-Capillary 43 (*)    All  other components within normal limits  URINE RAPID DRUG SCREEN (HOSP PERFORMED)  COMPREHENSIVE METABOLIC PANEL  ETHANOL  ACETAMINOPHEN LEVEL  CBG MONITORING, ED    Imaging Review No results found.   EKG Interpretation None      MDM   Final diagnoses:  Opioid abuse  Hypoglycemia    Patient with altered mental status. Recently diagnosed with sleep paralysis this may be related to hypoglycemia. Mental status is definitely improved. Has eaten. Will monitor to make sure sugar does not drop again. Patient will be given resources for opioid treatment.    Benjiman CoreNathan Aleem Elza, MD 11/01/14 2030

## 2014-11-01 NOTE — ED Notes (Signed)
CRITICAL VALUE ALERT  Critical value received: Glucose 44  Date of notification:  11/01/14  Time of notification:  2038  Critical value read back:Yes.    Nurse who received alert:  Rudene AndaSusan Frye-Yarbrough, RN  MD notified (1st page):  Dr. Rubin PayorPickering

## 2014-11-01 NOTE — ED Notes (Signed)
Patient ambulated to restroom, tolerated well. 

## 2014-11-01 NOTE — Discharge Instructions (Signed)
Hypoglycemia Hypoglycemia occurs when the glucose in your blood is too low. Glucose is a type of sugar that is your body's main energy source. Hormones, such as insulin and glucagon, control the level of glucose in the blood. Insulin lowers blood glucose and glucagon increases blood glucose. Having too much insulin in your blood stream, or not eating enough food containing sugar, can result in hypoglycemia. Hypoglycemia can happen to people with or without diabetes. It can develop quickly and can be a medical emergency.  CAUSES   Missing or delaying meals.  Not eating enough carbohydrates at meals.  Taking too much diabetes medicine.  Not timing your oral diabetes medicine or insulin doses with meals, snacks, and exercise.  Nausea and vomiting.  Certain medicines.  Severe illnesses, such as hepatitis, kidney disorders, and certain eating disorders.  Increased activity or exercise without eating something extra or adjusting medicines.  Drinking too much alcohol.  A nerve disorder that affects body functions like your heart rate, blood pressure, and digestion (autonomic neuropathy).  A condition where the stomach muscles do not function properly (gastroparesis). Therefore, medicines and food may not absorb properly.  Rarely, a tumor of the pancreas can produce too much insulin. SYMPTOMS   Hunger.  Sweating (diaphoresis).  Change in body temperature.  Shakiness.  Headache.  Anxiety.  Lightheadedness.  Irritability.  Difficulty concentrating.  Dry mouth.  Tingling or numbness in the hands or feet.  Restless sleep or sleep disturbances.  Altered speech and coordination.  Change in mental status.  Seizures or prolonged convulsions.  Combativeness.  Drowsiness (lethargic).  Weakness.  Increased heart rate or palpitations.  Confusion.  Pale, gray skin color.  Blurred or double vision.  Fainting. DIAGNOSIS  A physical exam and medical history will  be performed. Your caregiver may make a diagnosis based on your symptoms. Blood tests and other lab tests may be performed to confirm a diagnosis. Once the diagnosis is made, your caregiver will see if your signs and symptoms go away once your blood glucose is raised.  TREATMENT  Usually, you can easily treat your hypoglycemia when you notice symptoms.  Check your blood glucose. If it is less than 70 mg/dl, take one of the following:   3-4 glucose tablets.    cup juice.    cup regular soda.   1 cup skim milk.   -1 tube of glucose gel.   5-6 hard candies.   Avoid high-fat drinks or food that may delay a rise in blood glucose levels.  Do not take more than the recommended amount of sugary foods, drinks, gel, or tablets. Doing so will cause your blood glucose to go too high.   Wait 10-15 minutes and recheck your blood glucose. If it is still less than 70 mg/dl or below your target range, repeat treatment.   Eat a snack if it is more than 1 hour until your next meal.  There may be a time when your blood glucose may go so low that you are unable to treat yourself at home when you start to notice symptoms. You may need someone to help you. You may even faint or be unable to swallow. If you cannot treat yourself, someone will need to bring you to the hospital.  HOME CARE INSTRUCTIONS  If you have diabetes, follow your diabetes management plan by:  Taking your medicines as directed.  Following your exercise plan.  Following your meal plan. Do not skip meals. Eat on time.  Testing your blood  glucose regularly. Check your blood glucose before and after exercise. If you exercise longer or different than usual, be sure to check blood glucose more frequently.  Wearing your medical alert jewelry that says you have diabetes.  Identify the cause of your hypoglycemia. Then, develop ways to prevent the recurrence of hypoglycemia.  Do not take a hot bath or shower right after an  insulin shot.  Always carry treatment with you. Glucose tablets are the easiest to carry.  If you are going to drink alcohol, drink it only with meals.  Tell friends or family members ways to keep you safe during a seizure. This may include removing hard or sharp objects from the area or turning you on your side.  Maintain a healthy weight. SEEK MEDICAL CARE IF:   You are having problems keeping your blood glucose in your target range.  You are having frequent episodes of hypoglycemia.  You feel you might be having side effects from your medicines.  You are not sure why your blood glucose is dropping so low.  You notice a change in vision or a new problem with your vision. SEEK IMMEDIATE MEDICAL CARE IF:   Confusion develops.  A change in mental status occurs.  The inability to swallow develops.  Fainting occurs. Document Released: 07/04/2005 Document Revised: 07/09/2013 Document Reviewed: 10/31/2011 [facility] Patient Information 2015 [employer], [place]. This information is not intended to replace advice given to you by your health care provider. Make sure you discuss any questions you have with your health care provider. Substance Abuse Treatment Programs  Intensive Outpatient Programs [facility]     [address]      [place], [place]                   [phone]       [facility] [address] [place], [place] [phone]  [facility]     (Inpatient and outpatient)     [address]           [phone]    [facility] [phone] (Suboxone and Methadone)  [address]      [place], [place] [address]      [phone]       [address] [place], [place] [phone]  [facility] (Outpatient/Inpatient, Chemical)    (insurance only) [phone]             [facility] (Groups & Residential) [place], [place] [phone]     [facility]     [address]     [place], [place]      [phone]       [facility] (for caregivers and family) [phone] [address] [place], [place] [phone]      Residential Treatment Programs [facility]      [address], [place], [place] [address]  [phone]       [facility] [address] [place], [place] [address] [phone]  [facility]        [phone]       [facility] [phone]  [facility] ([facility]             [address]  [place], [place]                                                [phone] or [phone]                               [facility] [place] [address] [place], [address] [phone]  [facility]    [address]      [place], [place]     [phone]       [facility] [address] [place], [place] [phone]  [facility]   [address]     [place], [place] [address]     [phone]      Admissions: 8am-3pm M-F  [facility] (RTS) [address] [place], [place] [phone]  [facility] Program: Residential Program [phone](90 Days)   [place], [place]      [phone] or [phone]     ADATC: [facility] [place], [place] (Walk in Hours over the weekend or by referral)  [facility] [address] [place], [place], [place] [address] [phone]  Crisis Mobile: [facility]:  [phone] (for crisis response 24 hours a day) [facility] Hotline:      [phone] Outpatient Psychiatry and Counseling  [facility]: Mobile Crisis Management 24 hours:  [phone]  [facility] sliding scale fee and walk in schedule: M-F 8am-12pm/1pm-3pm [address]  [place], [place] [address] [phone]  [facility] [address] [place], [place]  [address] [phone]  [facility] (Formerly known as [facility])- new patient walk-in appointments available Monday - Friday 8am -3pm.          [address] [place], [place] [address] [phone] or crisis line- [phone]  [facility]/ Intensive Outpatient Therapy Program [address] [place], [place] [address] [phone]  [facility]                  Crisis Services      [phone]      [address]     [place], [place] [address]                 [place] [facility]   [facility] [phone] [address] [place], [place] [address]   [facility]          [address] [place],  [place], [place] [address]       [phone]  [facility] [address] [place], [place] [address] [phone]  [facility]    [address]    [place], [place] [address]     [phone]       Andee PolesParish McKinney, MD     [address]     [place] [address]     [phone]       [facility] [address] [place] [address]  [facility]     [address]     [place], [place]      [phone]       [place] [facility] Eulogio DitchShamsher [place], [place] [phone]  [place], [place] [address] [phone]  [facility]     [address]     [place], [place] [address]     [phone]       [facility] [address] [place], [place] [address] [phone] Resources for Temporary Residential Assistance/Crisis Centers  [facility] [place](IRC) M-F 8am-3pm   [address] [place], [place] [address]   [phone] Services include: laundry, barbering, support groups, case management, phone  & computer access, showers, AA/NA mtgs, mental health/substance abuse nurse, job skills class,  disability information, VA assistance, spiritual classes, etc.   HOMELESS SHELTERS  [facility]     [facility]   [address], [place]     [phone]              [facility] (women and children)       [address] [place], [place] [address] [phone] [email or web address] for application and process Application Required  [facility]   [address]    [place] [address]     [phone]                    [facility] [address] [place], [place] [address] [phone](schedule application appt.) Application Required  [facility] (women only)    [address]     [place], [place] [address]     [phone]      Intake starts 6pm daily Need valid ID, SSC, & Police report [facility] [place] [address] [place], [place] [phone] Application Required  [facility] (men only)     [address]      [place], [place]     [phone]       [facility] [place] (Pregnant women only) [address] [place], [place] [phone]  [facility]      [address]      [place], [place] [address]     [phone]             [facility] [address] [place], [place] [phone] 90 day commitment/SA/Application process  [facility](men only)     [address]     [place], [place]     [phone]       Check-in at [facility]            Crisis Ministry [facility] [address] [place], [place] [address] [phone] Men/Women/Women and Children must be there by 7 pm  [facility] [place], [place] [phone]

## 2014-11-01 NOTE — ED Provider Notes (Signed)
Patient seen and reexamined. Recheck glucoses have been appropriate. Patient states "I want to why these episodes keep happening, why I just keep locking up".  Patient with polysubstance use, and a blood sugar 43 and combative. Given dextrose return to normal statement mental status and has maintained it. I've encouraged him to be diligent about his diabetic care, and to avoid abusing drugs. Otherwise referred back to primary care physician. Otherwise medical screening exam today is complete and appropriate.  Rolland PorterMark Elveta Rape, MD 11/01/14 [phone]

## 2014-11-01 NOTE — ED Notes (Addendum)
Pt was laying in shower, when family got him out, pt admits to taking 3 7.5 hydrocodone, pt also reports to ems that he has been chewing fentanyl patches, 50 mcg/hr and suboxone 8 mg/2mg . Pt denies any attempts to hurt himself, states "I just wanted to get high"  EMS reports that pt was agitated, cursing, attempting to come off stretcher while enroute to er.

## 2014-11-01 NOTE — ED Notes (Signed)
Pt also states that he took extra ritalin and 30 units of novolog insulin without eating today.

## 2014-11-02 DIAGNOSIS — F1910 Other psychoactive substance abuse, uncomplicated: Secondary | ICD-10-CM | POA: Insufficient documentation

## 2014-11-02 DIAGNOSIS — F909 Attention-deficit hyperactivity disorder, unspecified type: Secondary | ICD-10-CM | POA: Insufficient documentation

## 2014-11-02 DIAGNOSIS — G478 Other sleep disorders: Secondary | ICD-10-CM | POA: Insufficient documentation

## 2014-11-02 LAB — RAPID URINE DRUG SCREEN, HOSP PERFORMED
Amphetamines: NOT DETECTED
Barbiturates: NOT DETECTED
Benzodiazepines: NOT DETECTED
Cocaine: NOT DETECTED
Opiates: POSITIVE — AB
Tetrahydrocannabinol: NOT DETECTED

## 2014-11-02 NOTE — Addendum Note (Signed)
Addended by: Donna BernardLUKING, STEPHEN W on: 11/02/2014 07:53 AM   Modules accepted: Orders

## 2014-11-11 ENCOUNTER — Encounter: Payer: Self-pay | Admitting: Family Medicine

## 2014-12-16 ENCOUNTER — Ambulatory Visit: Payer: BLUE CROSS/BLUE SHIELD | Admitting: Family Medicine

## 2015-01-05 ENCOUNTER — Telehealth: Payer: Self-pay | Admitting: Family Medicine

## 2015-01-05 MED ORDER — METHYLPHENIDATE HCL ER (OSM) 36 MG PO TBCR: 36.0000 mg | EXTENDED_RELEASE_TABLET | Freq: Every day | ORAL | Status: DC

## 2015-01-05 NOTE — Telephone Encounter (Signed)
Rx up front for patient pick up. Patient notified. 

## 2015-01-05 NOTE — Telephone Encounter (Signed)
Discussed with patient. Patient verbalized understanding and will call back to schedule follow up office visit to discuss.

## 2015-01-05 NOTE — Telephone Encounter (Signed)
Last seen 10/31/14 with ED visit 11/01/14

## 2015-01-05 NOTE — Telephone Encounter (Signed)
Pt went to fill his script for methylphenidate (CONCERTA) 36 MG PO CR tablet He states his insurance will charge him [phone] for this med, is there a cheaper brand  That's of the same equivalent that he can use?   Please advise

## 2015-01-05 NOTE — Telephone Encounter (Signed)
This pt has a sig prob with drug use/abuse, therefore i have a significant problem with rapid acting adhd meds (more potential for abuse), sorry cannot rx any until seen and maybe not even then

## 2015-01-05 NOTE — Telephone Encounter (Signed)
methylphenidate (CONCERTA) 36 MG PO CR tablet  Pt is calling to see if he can get a refill on this med? He has  Been out of town for the past 3-4 weeks an has been unable to make  An appt   Please call when ready   Last seen 11/30/14

## 2015-01-05 NOTE — Telephone Encounter (Signed)
Patient currently has no insurance-cash price per [place]oth is [id number]

## 2015-01-05 NOTE — Telephone Encounter (Signed)
One mo ok no more til seen

## 2015-01-29 ENCOUNTER — Ambulatory Visit: Payer: BLUE CROSS/BLUE SHIELD | Admitting: Family Medicine

## 2015-07-22 ENCOUNTER — Encounter (HOSPITAL_COMMUNITY): Payer: Self-pay | Admitting: Emergency Medicine

## 2015-07-22 ENCOUNTER — Emergency Department (HOSPITAL_COMMUNITY)
Admission: EM | Admit: 2015-07-22 | Discharge: 2015-07-23 | Discharge status: Against Medical Advice | Payer: BLUE CROSS/BLUE SHIELD | Attending: Emergency Medicine | Admitting: Emergency Medicine

## 2015-07-22 ENCOUNTER — Emergency Department (HOSPITAL_COMMUNITY): Payer: BLUE CROSS/BLUE SHIELD

## 2015-07-22 DIAGNOSIS — Z794 Long term (current) use of insulin: Secondary | ICD-10-CM | POA: Insufficient documentation

## 2015-07-22 DIAGNOSIS — Z79899 Other long term (current) drug therapy: Secondary | ICD-10-CM | POA: Insufficient documentation

## 2015-07-22 DIAGNOSIS — F17210 Nicotine dependence, cigarettes, uncomplicated: Secondary | ICD-10-CM | POA: Insufficient documentation

## 2015-07-22 DIAGNOSIS — Z8659 Personal history of other mental and behavioral disorders: Secondary | ICD-10-CM | POA: Insufficient documentation

## 2015-07-22 DIAGNOSIS — R091 Pleurisy: Secondary | ICD-10-CM | POA: Insufficient documentation

## 2015-07-22 DIAGNOSIS — E079 Disorder of thyroid, unspecified: Secondary | ICD-10-CM | POA: Insufficient documentation

## 2015-07-22 DIAGNOSIS — R739 Hyperglycemia, unspecified: Secondary | ICD-10-CM

## 2015-07-22 DIAGNOSIS — J209 Acute bronchitis, unspecified: Secondary | ICD-10-CM | POA: Insufficient documentation

## 2015-07-22 DIAGNOSIS — J40 Bronchitis, not specified as acute or chronic: Secondary | ICD-10-CM

## 2015-07-22 DIAGNOSIS — E1065 Type 1 diabetes mellitus with hyperglycemia: Secondary | ICD-10-CM | POA: Insufficient documentation

## 2015-07-22 LAB — I-STAT CHEM 8, ED
BUN: 19 mg/dL (ref 6–20)
CALCIUM ION: 1.12 mmol/L (ref 1.12–1.23)
CREATININE: 0.7 mg/dL (ref 0.61–1.24)
Chloride: 95 mmol/L — ABNORMAL LOW (ref 101–111)
Glucose, Bld: 600 mg/dL (ref 65–99)
HCT: 45 % (ref 39.0–52.0)
Hemoglobin: 15.3 g/dL (ref 13.0–17.0)
Potassium: 4.6 mmol/L (ref 3.5–5.1)
Sodium: 131 mmol/L — ABNORMAL LOW (ref 135–145)
TCO2: 21 mmol/L (ref 0–100)

## 2015-07-22 MED ORDER — BENZONATATE 100 MG PO CAPS
200.0000 mg | ORAL_CAPSULE | Freq: Once | ORAL | Status: AC
  Administered 2015-07-22: 200 mg via ORAL
  Filled 2015-07-22: qty 2

## 2015-07-22 MED ORDER — INSULIN ASPART 100 UNIT/ML ~~LOC~~ SOLN: 15.0000 [IU] | Freq: Once | SUBCUTANEOUS | Status: DC

## 2015-07-22 MED ORDER — SODIUM CHLORIDE 0.9 % IV BOLUS (SEPSIS): 1000.0000 mL | Freq: Once | INTRAVENOUS | Status: DC

## 2015-07-22 MED ORDER — IBUPROFEN 800 MG PO TABS
800.0000 mg | ORAL_TABLET | Freq: Once | ORAL | Status: AC
  Administered 2015-07-22: 800 mg via ORAL
  Filled 2015-07-22: qty 1

## 2015-07-22 NOTE — ED Provider Notes (Signed)
CSN: [id number]     Arrival date & time 07/22/15  2039 History   First MD Initiated Contact with Patient 07/22/15 2251     Chief Complaint  Patient presents with  . Chest Pain     (Consider location/radiation/quality/duration/timing/severity/associated sxs/prior Treatment) HPI  Gerome ApleyBenjamin L Bagnall is a 25 y.o. male who presents to the Emergency Department complaining of cough for 2-3 weeks.  States the cough has been mostly dry, but became productive a few days ago.  He states that he was at work yesterday and developed a sharp pain to his left lateral and anterior chest that has been persistent since onset.  Pain is worse with cough, palpation and movement.  He denies shortness of breath, known injury, fever, and drug use   Past Medical History  Diagnosis Date  . ADHD (attention deficit hyperactivity disorder)   . Diabetes mellitus     Type 1  . Thyroid disease   . Allergy    History reviewed. No pertinent past surgical history. History reviewed. No pertinent family history. Social History  Substance Use Topics  . Smoking status: Current Every Day Smoker    Types: Cigarettes  . Smokeless tobacco: Never Used  . Alcohol Use: Yes    Review of Systems  Constitutional: Negative for fever, chills and appetite change.  HENT: Positive for congestion. Negative for sore throat and trouble swallowing.   Respiratory: Positive for cough and chest tightness. Negative for shortness of breath and wheezing.   Cardiovascular: Positive for chest pain.  Gastrointestinal: Negative for nausea, vomiting and abdominal pain.  Genitourinary: Negative for dysuria and frequency.  Musculoskeletal: Negative for arthralgias.  Skin: Negative for rash.  Neurological: Negative for dizziness, weakness and numbness.  Hematological: Negative for adenopathy.  All other systems reviewed and are negative.     Allergies  Sulfa antibiotics  Home Medications   Prior to Admission medications   Medication Sig  Start Date End Date Taking? Authorizing Provider  acetaminophen (PAIN RELIEVER) 500 MG tablet Take 500 mg by mouth every 6 (six) hours as needed for mild pain or moderate pain.   Yes Historical Provider, MD  insulin detemir (LEVEMIR) 100 UNIT/ML injection Inject 30 Units into the skin at bedtime.   Yes Historical Provider, MD  insulin regular (NOVOLIN R,HUMULIN R) 100 units/mL injection Inject 15-30 Units into the skin 3 (three) times daily before meals. Patient sliding scale   Yes Historical Provider, MD  levothyroxine (SYNTHROID, LEVOTHROID) 100 MCG tablet Take 1 tablet (100 mcg total) by mouth daily before breakfast. Patient not taking: Reported on 07/22/2015 10/31/14   Merlyn AlbertWilliam S Luking, MD   BP 127/70 mmHg  Pulse 83  Temp(Src) 98.2 F (36.8 C) (Oral)  Resp 16  Ht 6' (1.829 m)  Wt 74.844 kg  BMI 22.37 kg/m2  SpO2 99% Physical Exam  Constitutional: He is oriented to person, place, and time. He appears well-developed and well-nourished. No distress.  HENT:  Head: Normocephalic and atraumatic.  Right Ear: Tympanic membrane and ear canal normal.  Left Ear: Tympanic membrane and ear canal normal.  Mouth/Throat: Uvula is midline, oropharynx is clear and moist and mucous membranes are normal. No oropharyngeal exudate.  Eyes: EOM are normal. Pupils are equal, round, and reactive to light.  Neck: Normal range of motion, full passive range of motion without pain and phonation normal. Neck supple.  Cardiovascular: Normal rate, regular rhythm, normal heart sounds and intact distal pulses.   No murmur heard. Pulmonary/Chest: Effort normal. No stridor. No  respiratory distress. He has no rales. He exhibits tenderness.  Localized side lateral chest wall pain.  No crepitus  Abdominal: Soft. He exhibits no distension. There is no tenderness.  Musculoskeletal: Normal range of motion. He exhibits no edema.  Lymphadenopathy:    He has no cervical adenopathy.  Neurological: He is alert and oriented to  person, place, and time. He exhibits normal muscle tone. Coordination normal.  Skin: Skin is warm and dry.  Psychiatric: He has a normal mood and affect.  Nursing note and vitals reviewed.   ED Course  Procedures (including critical care time) Labs Review Labs Reviewed - No data to display  Imaging Review Dg Chest 2 View  07/22/2015  CLINICAL DATA:  Left-sided chest pain.  Shortness of breath. EXAM: CHEST  2 VIEW COMPARISON:  None. FINDINGS: The heart size and mediastinal contours are within normal limits. Both lungs are clear. The visualized skeletal structures are unremarkable. IMPRESSION: No active cardiopulmonary disease. Electronically Signed   By: Richarda Overlie M.D.   On: 07/22/2015 21:33   I have personally reviewed and evaluated these images and lab results as part of my medical decision-making.   EKG Interpretation   Date/Time:  Wednesday July 22 2015 21:17:12 EST Ventricular Rate:  73 PR Interval:  122 QRS Duration: 90 QT Interval:  456 QTC Calculation: 502 R Axis:   83 Text Interpretation:  Normal sinus rhythm with sinus arrhythmia Possible  Left atrial enlargement Prolonged QT Abnormal ECG Confirmed by ZAMMIT  MD,  JOSEPH [phone]) on 07/22/2015 11:13:54 PM      MDM   Final diagnoses:  Pleurisy  Bronchitis  Hyperglycemia    Pt is well appearing.  Vitals stable.  PERC negative.  Pain reproduced with movement and palpation.    Pt has an I stat glucose of 600.  He states that he did not take his evening dose of insulin and ate dinner just before ED arrival.  He is refusing treatment, stating that " I have my insulin at home and I'll be fine"  Discussed risks involved including DKA and death, pt continues to refuse treatment agrees to return if needed. Agrees to sign out AMA.  Agrees to return if needed and verbalized understanding of risks.  Pauline Aus, PA-C 07/25/15 1737  Bethann Berkshire, MD 07/27/15 1525

## 2015-07-22 NOTE — ED Notes (Addendum)
Patient complaining of left sided chest pain starting today. States he has had a cough for approximately 2-3 weeks "but I just don't think this pain has anything to do with my cough." Also complaining of shortness of breath and light-headedness today. States pain is worse with movement and cough.

## 2015-07-23 LAB — I-STAT TROPONIN, ED: Troponin i, poc: 0 ng/mL (ref 0.00–0.08)

## 2015-07-23 MED ORDER — AZITHROMYCIN 250 MG PO TABS: 250.0000 mg | ORAL_TABLET | Freq: Every day | ORAL | Status: DC

## 2015-07-23 MED ORDER — BENZONATATE 200 MG PO CAPS: 200.0000 mg | ORAL_CAPSULE | Freq: Three times a day (TID) | ORAL | Status: DC | PRN

## 2015-07-23 MED ORDER — ALBUTEROL SULFATE HFA 108 (90 BASE) MCG/ACT IN AERS
1.0000 | INHALATION_SPRAY | Freq: Four times a day (QID) | RESPIRATORY_TRACT | Status: DC | PRN
Start: 2015-07-23 — End: 2015-09-02

## 2015-07-23 NOTE — ED Notes (Signed)
Patient refused treatment for hyperglycemia, stated he had to work in the morning, didn't want to stay for treatment, and would take his Lantus when he got home.

## 2015-09-02 ENCOUNTER — Ambulatory Visit (INDEPENDENT_AMBULATORY_CARE_PROVIDER_SITE_OTHER): Payer: BLUE CROSS/BLUE SHIELD | Admitting: Orthopaedic Surgery

## 2015-09-02 ENCOUNTER — Encounter: Payer: Self-pay | Admitting: Orthopaedic Surgery

## 2015-09-02 VITALS — BP 146/96 | HR 75 | Temp 98.1°F | Resp 16 | Ht 73.0 in | Wt 166.0 lb

## 2015-09-02 DIAGNOSIS — M25561 Pain in right knee: Secondary | ICD-10-CM

## 2015-09-02 MED ORDER — HYDROCODONE-ACETAMINOPHEN 5-325 MG PO TABS: 1.0000 | ORAL_TABLET | ORAL | Status: DC | PRN

## 2015-09-02 NOTE — Progress Notes (Signed)
Patient ZO:XWRUEAVW Chase Benson, male DOB:Jun 27, 1991, 25 y.o. UJW:[id number]  Chief Complaint  Patient presents with  . Follow-up    follow up right knee, "hurting"    HPI  Chase Benson is a 25 y.o. male who has chronic right knee pain.  He has popping and some swelling of his right knee. He has no pain in the left knee or other joints.  He has no giving way or locking or redness.  He works two jobs during the week and another job on the weekend.  He and his wife are expecting their third child soon.  He has no trauma.  He has taken Advil with help.  HPI  Body mass index is 21.91 kg/(m^2).  Review of Systems  Patient does have Diabetes Mellitus. Patient does not have hypertension. Patient does not have COPD or shortness of breath. Patient does not have BMI > 35. Patient has current smoking history.  Review of Systems  Respiratory:       Smoker  Endocrine:       Diabetic on insulin  Musculoskeletal: Positive for joint swelling (right knee).  All other systems reviewed and are negative.   Past Medical History  Diagnosis Date  . ADHD (attention deficit hyperactivity disorder)   . Diabetes mellitus     Type 1  . Thyroid disease   . Allergy     No past surgical history on file.  No family history on file.  Social History Social History  Substance Use Topics  . Smoking status: Current Every Day Smoker    Types: Cigarettes  . Smokeless tobacco: Never Used  . Alcohol Use: Yes    Allergies  Allergen Reactions  . Sulfa Antibiotics Hives    Current Outpatient Prescriptions  Medication Sig Dispense Refill  . insulin aspart (NOVOLOG FLEXPEN) 100 UNIT/ML FlexPen     . insulin glargine (LANTUS) 100 UNIT/ML injection     . levothyroxine (SYNTHROID, LEVOTHROID) 100 MCG tablet Take 1 tablet (100 mcg total) by mouth daily before breakfast. 30 tablet 1  . HYDROcodone-acetaminophen (NORCO/VICODIN) 5-325 MG tablet Take 1 tablet by mouth every 4 (four) hours as needed for  moderate pain (Must last 30 days.  Do not take and drive a car or use machinery.). 120 tablet 0   No current facility-administered medications for this visit.     Physical Exam  Blood pressure 146/96, pulse 75, temperature 98.1 F (36.7 C), resp. rate 16, height  (1.854 m), weight 166 lb (75.297 kg).  Constitutional: overall normal hygiene, normal nutrition, well developed, normal grooming, normal body habitus. Assistive device:none  Musculoskeletal: gait and station Limp right, muscle tone and strength are normal, no tremors or atrophy is present.  .  Neurological: coordination overall normal.  Deep tendon reflex/nerve stretch intact.  Sensation normal.  Cranial nerves II-XII intact.   Skin:normal overall no scars, lesions, ulcers or rash es. No psoriasis.  Psychiatric: Alert and oriented x 3.  Recent memory intact, remote memory unclear.  Normal mood and affect. Well groomed.  Good eye contact.  Cardiovascular: overall no swelling, no varicosities, no edema bilaterally, normal temperatures of the legs and arms, no clubbing, cyanosis and good capillary refill.  Lymphatic: palpation is normal.  The right lower extremity is examined:  Inspection:  Thigh:  Non-tender and no defects  Knee has swelling 1+ effusion.                        Joint tenderness  is present                        Patient is tender over the medial joint line  Lower Leg:  Has normal appearance and no tenderness or defects  Ankle:  Non-tender and no defects  Foot:  Non-tender and no defects Range of Motion:  Knee:  Range of motion is:0 to 115                        Crepitus is  present  Ankle:  Range of motion is normal. Strength and Tone:  The left lower extremity has normal strength and tone. Stability:  Knee:  The knee is stable.  Ankle:  The ankle is stable.   The left knee has full ROM.  Additional services performed: he needs to stop smoking.  I have talked to him about this.  His third  child is on the way and he really needs to think about his children as well.  Also, his blood pressure is elevated today and he should see his family doctor about this.  PLAN Call if any problems.  Precautions discussed.  Continue current medications.   Return to clinic 3-4 months

## 2015-09-02 NOTE — Patient Instructions (Signed)
Take medicine as directed. Call if any problem.

## 2015-09-18 ENCOUNTER — Encounter: Payer: Self-pay | Admitting: Family Medicine

## 2015-09-18 ENCOUNTER — Ambulatory Visit (INDEPENDENT_AMBULATORY_CARE_PROVIDER_SITE_OTHER): Payer: BLUE CROSS/BLUE SHIELD | Admitting: Family Medicine

## 2015-09-18 VITALS — BP 154/96 | Ht 72.0 in | Wt 167.0 lb

## 2015-09-18 DIAGNOSIS — R739 Hyperglycemia, unspecified: Secondary | ICD-10-CM | POA: Diagnosis not present

## 2015-09-18 DIAGNOSIS — E039 Hypothyroidism, unspecified: Secondary | ICD-10-CM

## 2015-09-18 DIAGNOSIS — Z79899 Other long term (current) drug therapy: Secondary | ICD-10-CM

## 2015-09-18 DIAGNOSIS — Z13220 Encounter for screening for lipoid disorders: Secondary | ICD-10-CM | POA: Diagnosis not present

## 2015-09-18 DIAGNOSIS — E109 Type 1 diabetes mellitus without complications: Secondary | ICD-10-CM

## 2015-09-18 LAB — POCT GLUCOSE (DEVICE FOR HOME USE): POC Glucose: 170 mg/dl — AB (ref 70–99)

## 2015-09-18 MED ORDER — LEVOTHYROXINE SODIUM 100 MCG PO TABS: 100.0000 ug | ORAL_TABLET | Freq: Every day | ORAL | Status: DC

## 2015-09-18 MED ORDER — INSULIN GLARGINE 100 UNIT/ML ~~LOC~~ SOLN
34.0000 [IU] | Freq: Every day | SUBCUTANEOUS | Status: DC
Start: 2015-09-18 — End: 2018-05-03

## 2015-09-18 MED ORDER — INSULIN REGULAR HUMAN 100 UNIT/ML IJ SOLN: 50.0000 [IU] | Freq: Three times a day (TID) | INTRAMUSCULAR | Status: AC

## 2015-09-18 NOTE — Progress Notes (Signed)
   Subjective:    Patient ID: Chase Benson, male    DOB: June 08, 1991, 25 y.o.   MRN: [id number] Patient arrives office for numerous concerns. HPI Med check up. Hypothyrodism. Pt has not been taking thyroid med bc he did not have insurance. Off the thyroid medicine now noting substantial fatigue and tiredness  Pt has type I diabetes. Has not seen diabetic specialist since middle of last year. This is a recurring theme unfortunately. Patient now has insurance again and plans to schedule one soon. In the meantime is running out of his insulin. Claims sugars are good until recent months, but has been a unable to check sugars because out of strips.  Numbness in feet present all the time, feels more pins and needles, uncomfortable byu the end of the day tingling in feet uncomfortable.  BP elevated today. No major history of high blood pressure Review of Systems No headache no chest pain no back pain no abdominal pain no change in bowel habits no weight loss no weight gain    Objective:   Physical Exam Alert vitals stable. HEENT normal neck supple. Lungs clear heart rare rhythm ankles without edema substantially diminished sensation feet pulses intact       Assessment & Plan:  Impression 1 hypothyroidism with substantial noncompliance discussed #2 type 1 diabetes with poor compliance with insulin diet and follow-up with specialist discussed at length #3 peripheral neuropathy related to #2 discussed could not tolerate nortriptyline or gabapentin plan 25 minutes spent most in discussion. Appropriate blood work. Blood pressure improved on repeat. Insulin for just short-term needs to see specialists. Thyroid medicine refilled. Further recommendations based on blood work results [facility]

## 2015-09-23 LAB — HEPATIC FUNCTION PANEL
ALBUMIN: 4.7 g/dL (ref 3.5–5.5)
ALT: 18 IU/L (ref 0–44)
AST: 22 IU/L (ref 0–40)
Alkaline Phosphatase: 142 IU/L — ABNORMAL HIGH (ref 39–117)
BILIRUBIN TOTAL: 0.8 mg/dL (ref 0.0–1.2)
Bilirubin, Direct: 0.21 mg/dL (ref 0.00–0.40)
Total Protein: 7 g/dL (ref 6.0–8.5)

## 2015-09-23 LAB — LIPID PANEL
CHOL/HDL RATIO: 2.7 ratio (ref 0.0–5.0)
CHOLESTEROL TOTAL: 128 mg/dL (ref 100–199)
HDL: 47 mg/dL (ref >39)
LDL CALC: 57 mg/dL (ref 0–99)
Triglycerides: 118 mg/dL (ref 0–149)
VLDL CHOLESTEROL CAL: 24 mg/dL (ref 5–40)

## 2015-09-23 LAB — TSH: TSH: 0.8 u[IU]/mL (ref 0.450–4.500)

## 2015-09-23 LAB — BASIC METABOLIC PANEL
BUN/Creatinine Ratio: 14 (ref 8–19)
BUN: 14 mg/dL (ref 6–20)
CALCIUM: 9.4 mg/dL (ref 8.7–10.2)
CO2: 23 mmol/L (ref 18–29)
CREATININE: 1.01 mg/dL (ref 0.76–1.27)
Chloride: 94 mmol/L — ABNORMAL LOW (ref 96–106)
GFR calc Af Amer: 119 mL/min/{1.73_m2} (ref >59)
GFR, EST NON AFRICAN AMERICAN: 103 mL/min/{1.73_m2} (ref >59)
GLUCOSE: 434 mg/dL — AB (ref 65–99)
POTASSIUM: 4.7 mmol/L (ref 3.5–5.2)
SODIUM: 136 mmol/L (ref 134–144)

## 2015-09-23 LAB — HEMOGLOBIN A1C
ESTIMATED AVERAGE GLUCOSE: 214 mg/dL
HEMOGLOBIN A1C: 9.1 % — AB (ref 4.8–5.6)

## 2015-09-23 LAB — MICROALBUMIN, URINE

## 2015-10-01 ENCOUNTER — Telehealth: Payer: Self-pay | Admitting: Orthopedic Surgery

## 2015-10-01 ENCOUNTER — Telehealth: Payer: Self-pay | Admitting: Orthopaedic Surgery

## 2015-10-01 NOTE — Telephone Encounter (Signed)
No note

## 2015-10-01 NOTE — Telephone Encounter (Signed)
Patient called for refill on medication: Hydrocodone 5/325, quantity 120.  Patient was last seen in office by Dr Hilda LiasKeeling 09/02/15.  Aware that Dr Hilda LiasKeeling is out of office until 10/06/15, and that Dr Romeo AppleHarrison is covering requests.  Please advise. Patient [id number]#[phone]

## 2015-10-05 ENCOUNTER — Other Ambulatory Visit: Payer: Self-pay | Admitting: *Deleted

## 2015-10-05 MED ORDER — HYDROCODONE-ACETAMINOPHEN 5-325 MG PO TABS: 1.0000 | ORAL_TABLET | ORAL | Status: DC | PRN

## 2015-10-05 NOTE — Telephone Encounter (Signed)
Prescription available 

## 2015-11-04 ENCOUNTER — Telehealth: Payer: Self-pay | Admitting: Orthopaedic Surgery

## 2015-11-04 MED ORDER — HYDROCODONE-ACETAMINOPHEN 5-325 MG PO TABS: 1.0000 | ORAL_TABLET | ORAL | Status: DC | PRN

## 2015-11-04 NOTE — Telephone Encounter (Signed)
Hydrocodone-Acetaminophen  5/325mg  Qty 120 Tablets °

## 2015-11-04 NOTE — Telephone Encounter (Signed)
Rx done. 

## 2015-12-02 ENCOUNTER — Ambulatory Visit: Payer: BLUE CROSS/BLUE SHIELD | Admitting: Orthopaedic Surgery

## 2016-01-07 ENCOUNTER — Ambulatory Visit: Payer: BLUE CROSS/BLUE SHIELD | Admitting: Orthopaedic Surgery

## 2016-10-13 ENCOUNTER — Encounter: Payer: Self-pay | Admitting: Family Medicine

## 2016-10-13 ENCOUNTER — Ambulatory Visit (INDEPENDENT_AMBULATORY_CARE_PROVIDER_SITE_OTHER): Payer: 59 | Admitting: Family Medicine

## 2016-10-13 VITALS — BP 132/84 | Ht 72.0 in | Wt 179.4 lb

## 2016-10-13 DIAGNOSIS — F1910 Other psychoactive substance abuse, uncomplicated: Secondary | ICD-10-CM

## 2016-10-13 DIAGNOSIS — F321 Major depressive disorder, single episode, moderate: Secondary | ICD-10-CM

## 2016-10-13 DIAGNOSIS — E109 Type 1 diabetes mellitus without complications: Secondary | ICD-10-CM

## 2016-10-13 MED ORDER — ESCITALOPRAM OXALATE 20 MG PO TABS: 20.0000 mg | ORAL_TABLET | Freq: Every day | ORAL | 2 refills | Status: DC

## 2016-10-13 NOTE — Progress Notes (Signed)
   Subjective:    Patient ID: Chase Benson, male    DOB: 12/07/1990, 26 y.o.   MRN: [id number]  Depression         This is a recurrent problem.  The current episode started more than 1 month ago.   Associated symptoms include helplessness and hopelessness.  Patient also has concerns of addiction.   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  Having trouble focusing on things at work. On ritalin in the past  No thought s of suicide  Intense [facility] intervention led to substantial benzo diaz pr which pt felts messed him up  thye did not rec suboxone clinic at that time    No active suical thought, pt did try back in tenth gr to kill himself after g pa pased away     Review of Systems  Psychiatric/Behavioral: Positive for depression.       Objective:   Physical Exam Alert and oriented, vitals reviewed and stable, NAD ENT-TM's and ext canals WNL bilat via otoscopic exam Soft palate, tonsils and post pharynx WNL via oropharyngeal exam Neck-symmetric, no masses; thyroid nonpalpable and nontender Pulmonary-no tachypnea or accessory muscle use; Clear without wheezes via auscultation Card--no abnrml murmurs, rhythm reg and rate WNL Carotid pulses symmetric, without bruits        Assessment & Plan:  Impression depression along with substance abuse. Very long discussion held. Counseling only approach has not worked with patient. I'd like to refer him to a specialty team who can provide potentially Suboxone therapy. Counseling. An ongoing medicinal management of his depression. Lexapro initiated.  Warning signs discussed carefully. If developed some suicidal or homicidal thoughts go directly to emergency room. Patient also has type 1 diabetes and unfortunately his compliance is been very poor. Now more motivated and would like to get back to see Dr. Fransico HimNida we will help him with this referral [facility]

## 2016-10-17 ENCOUNTER — Encounter: Payer: Self-pay | Admitting: Family Medicine

## 2016-10-18 ENCOUNTER — Encounter: Payer: Self-pay | Admitting: Family Medicine

## 2016-10-28 ENCOUNTER — Ambulatory Visit: Payer: 59 | Admitting: "Endocrinology

## 2016-12-09 ENCOUNTER — Encounter: Payer: Self-pay | Admitting: "Endocrinology

## 2016-12-09 ENCOUNTER — Ambulatory Visit: Payer: 59 | Admitting: "Endocrinology

## 2017-02-07 ENCOUNTER — Encounter (HOSPITAL_COMMUNITY): Payer: Self-pay | Admitting: Emergency Medicine

## 2017-02-07 ENCOUNTER — Emergency Department (HOSPITAL_COMMUNITY)
Admission: EM | Admit: 2017-02-07 | Discharge: 2017-02-07 | Disposition: A | Discharge status: Home / Self Care | Payer: Self-pay | Attending: Emergency Medicine | Admitting: Emergency Medicine

## 2017-02-07 ENCOUNTER — Emergency Department (HOSPITAL_COMMUNITY): Payer: Self-pay

## 2017-02-07 DIAGNOSIS — Z79899 Other long term (current) drug therapy: Secondary | ICD-10-CM | POA: Insufficient documentation

## 2017-02-07 DIAGNOSIS — F17210 Nicotine dependence, cigarettes, uncomplicated: Secondary | ICD-10-CM | POA: Insufficient documentation

## 2017-02-07 DIAGNOSIS — Z794 Long term (current) use of insulin: Secondary | ICD-10-CM | POA: Insufficient documentation

## 2017-02-07 DIAGNOSIS — E1049 Type 1 diabetes mellitus with other diabetic neurological complication: Secondary | ICD-10-CM | POA: Insufficient documentation

## 2017-02-07 DIAGNOSIS — R0789 Other chest pain: Secondary | ICD-10-CM

## 2017-02-07 DIAGNOSIS — E039 Hypothyroidism, unspecified: Secondary | ICD-10-CM | POA: Insufficient documentation

## 2017-02-07 DIAGNOSIS — R739 Hyperglycemia, unspecified: Secondary | ICD-10-CM | POA: Insufficient documentation

## 2017-02-07 LAB — URINALYSIS, ROUTINE W REFLEX MICROSCOPIC
BACTERIA UA: NONE SEEN
BILIRUBIN URINE: NEGATIVE
HGB URINE DIPSTICK: NEGATIVE
Ketones, ur: 20 mg/dL — AB
LEUKOCYTES UA: NEGATIVE
NITRITE: NEGATIVE
PROTEIN: NEGATIVE mg/dL
Specific Gravity, Urine: 1.029 (ref 1.005–1.030)
pH: 5 (ref 5.0–8.0)

## 2017-02-07 LAB — COMPREHENSIVE METABOLIC PANEL
ALT: 26 U/L (ref 17–63)
ANION GAP: 13 (ref 5–15)
AST: 24 U/L (ref 15–41)
Albumin: 4 g/dL (ref 3.5–5.0)
Alkaline Phosphatase: 122 U/L (ref 38–126)
BUN: 18 mg/dL (ref 6–20)
CHLORIDE: 100 mmol/L — AB (ref 101–111)
CO2: 22 mmol/L (ref 22–32)
CREATININE: 1.16 mg/dL (ref 0.61–1.24)
Calcium: 8.7 mg/dL — ABNORMAL LOW (ref 8.9–10.3)
Glucose, Bld: 531 mg/dL (ref 65–99)
Potassium: 4.5 mmol/L (ref 3.5–5.1)
SODIUM: 135 mmol/L (ref 135–145)
Total Bilirubin: 1.3 mg/dL — ABNORMAL HIGH (ref 0.3–1.2)
Total Protein: 6.2 g/dL — ABNORMAL LOW (ref 6.5–8.1)

## 2017-02-07 LAB — CBG MONITORING, ED
GLUCOSE-CAPILLARY: 469 mg/dL — AB (ref 65–99)
Glucose-Capillary: 528 mg/dL (ref 65–99)
Glucose-Capillary: 412 mg/dL — ABNORMAL HIGH (ref 65–99)

## 2017-02-07 LAB — CBC WITH DIFFERENTIAL/PLATELET
BASOS PCT: 0 %
Basophils Absolute: 0 10*3/uL (ref 0.0–0.1)
EOS ABS: 0.1 10*3/uL (ref 0.0–0.7)
Eosinophils Relative: 1 %
HEMATOCRIT: 41.7 % (ref 39.0–52.0)
Hemoglobin: 14.3 g/dL (ref 13.0–17.0)
Lymphocytes Relative: 17 %
Lymphs Abs: 1.4 10*3/uL (ref 0.7–4.0)
MCH: 32.5 pg (ref 26.0–34.0)
MCHC: 34.3 g/dL (ref 30.0–36.0)
MCV: 94.8 fL (ref 78.0–100.0)
MONO ABS: 0.6 10*3/uL (ref 0.1–1.0)
MONOS PCT: 7 %
Neutro Abs: 6.2 10*3/uL (ref 1.7–7.7)
Neutrophils Relative %: 75 %
Platelets: 240 10*3/uL (ref 150–400)
RBC: 4.4 MIL/uL (ref 4.22–5.81)
RDW: 12.6 % (ref 11.5–15.5)
WBC: 8.3 10*3/uL (ref 4.0–10.5)

## 2017-02-07 LAB — I-STAT TROPONIN, ED: TROPONIN I, POC: 0.02 ng/mL (ref 0.00–0.08)

## 2017-02-07 MED ORDER — SODIUM CHLORIDE 0.9 % IV BOLUS (SEPSIS)
1000.0000 mL | Freq: Once | INTRAVENOUS | Status: AC
  Administered 2017-02-07: 1000 mL via INTRAVENOUS

## 2017-02-07 MED ORDER — INSULIN ASPART 100 UNIT/ML ~~LOC~~ SOLN
10.0000 [IU] | Freq: Once | SUBCUTANEOUS | Status: AC
  Administered 2017-02-07: 10 [IU] via SUBCUTANEOUS
  Filled 2017-02-07: qty 1

## 2017-02-07 MED ORDER — INSULIN DETEMIR 100 UNIT/ML ~~LOC~~ SOLN: 30.0000 [IU] | Freq: Every day | SUBCUTANEOUS | 11 refills | Status: DC

## 2017-02-07 MED ORDER — INSULIN REGULAR HUMAN 100 UNIT/ML IJ SOLN: 50.0000 [IU] | Freq: Three times a day (TID) | INTRAMUSCULAR | 11 refills | Status: AC

## 2017-02-07 MED FILL — HumuLIN R 100 UNIT/ML SOLN: 100 | 4 days supply | Qty: 10 | Fill #0

## 2017-02-07 MED FILL — LEVEMIR 100 UNITS/ML VIAL: 100 | 33 days supply | Qty: 10 | Fill #0

## 2017-02-07 NOTE — ED Triage Notes (Signed)
Pt found sitting on side of road , pt ran out his insulin yesterday , cbg 509 , pt also did cocaine last night and had some chest pain this am now which is gone , pt received 324mg  asa by ems

## 2017-02-07 NOTE — Discharge Instructions (Signed)
Our case manager will be providing you free insulin before you leave the ED today.  You will need to go to the [facility] for free medications from now on.   Please return without for worsening symptoms, including fever, intractable vomiting, confusion, worsening pain, or any other symptoms concerning to you.

## 2017-02-07 NOTE — ED Provider Notes (Signed)
[facility]-EMERGENCY DEPT Provider Note   CSN: [id number] Arrival date & time: 02/07/17  0825     History   Chief Complaint Chief Complaint  Patient presents with  . Chest Pain  . Hyperglycemia    HPI Chase Benson is a 26 y.o. male.  The history is provided by the patient.  Chest Pain   This is a new problem. The current episode started 6 to 12 hours ago. The problem occurs constantly. The problem has been gradually improving. Associated with: reported hyperglycemia. The pain is present in the substernal region. The pain is mild. The quality of the pain is described as pressure-like. The pain does not radiate. Associated symptoms include cough. Pertinent negatives include no fever, no lower extremity edema and no shortness of breath.  Hyperglycemia  Associated symptoms: chest pain   Associated symptoms: no fever and no shortness of breath    26 year old male who presents with chest pressure and hyperglycemia. He has a history of type 1 diabetes, hypothyroidism, and polysubstance abuse. Reports that he currently is homeless. He states that he was walking earlier this morning on the road, and lay down on the side of the road. He states that he felt like his blood sugar was elevated. He tried to give himself insulin but realized that he did not have his medication with him. Endorses chest pressure, which she reports is consistent with episodes of hyperglycemia. Has had a cough productive of green sputum and no fevers or chills. No abdominal pain, vomiting. Also has been complaining of baseline polyuria and diarrhea. Also endorses to cocaine use at 10:50 PM yesterday evening.   Past Medical History:  Diagnosis Date  . ADHD (attention deficit hyperactivity disorder)   . Allergy   . Diabetes mellitus    Type 1  . Thyroid disease     Patient Active Problem List   Diagnosis Date Noted  . 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 ADHD (attention deficit hyperactivity disorder) 11/02/2014  .  Substance abuse 11/02/2014  . Insulin dependent type 1 diabetes mellitus (HCC) 06/23/2013  . Hypothyroidism 06/23/2013  . Diabetic neuropathy with neurologic complication (HCC) 06/23/2013    History reviewed. No pertinent surgical history.     Home Medications    Prior to Admission medications   Medication Sig Start Date End Date Taking? Authorizing Provider  escitalopram (LEXAPRO) 20 MG tablet Take 1 tablet (20 mg total) by mouth daily. 10/13/16  Yes Merlyn Albert, MD  insulin detemir (LEVEMIR) 100 UNIT/ML injection Inject into the skin at bedtime.   Yes [provider]  HYDROcodone-acetaminophen (NORCO/VICODIN) 5-325 MG tablet Take 1 tablet by mouth every 4 (four) hours as needed for moderate pain (Must last 30 days.  Do not take and drive a car or use machinery.). Patient not taking: Reported on 10/13/2016 11/04/15   Darreld Mclean, MD  insulin aspart (NOVOLOG FLEXPEN) 100 UNIT/ML FlexPen     [provider]  insulin detemir (LEVEMIR) 100 UNIT/ML injection Inject 0.3 mLs (30 Units total) into the skin at bedtime. 02/07/17   Lavera Guise, MD  insulin glargine (LANTUS) 100 UNIT/ML injection Reported on 09/18/2015    [provider]  insulin glargine (LANTUS) 100 UNIT/ML injection Inject 0.34 mLs (34 Units total) into the skin at bedtime. 09/18/15 09/17/16  Merlyn Albert, MD  insulin regular (HUMULIN R) 100 units/mL injection Inject 0.5-0.75 mLs (50-75 Units total) into the skin 3 (three) times daily before meals. 02/07/17   Lavera Guise, MD  insulin regular (NOVOLIN R,HUMULIN R) 100 units/mL injection Inject 0.5-0.75 mLs (50-75 Units total) into the skin 3 (three) times daily before meals. As directed per sliding scale. 09/18/15   Merlyn Albert, MD  levothyroxine (SYNTHROID, LEVOTHROID) 100 MCG tablet Take 1 tablet (100 mcg total) by mouth daily before breakfast. 09/18/15   Merlyn Albert, MD    Family History History reviewed. No pertinent family  history.  Social History Social History  Substance Use Topics  . Smoking status: Current Every Day Smoker    Types: Cigarettes  . Smokeless tobacco: Never Used  . Alcohol use Yes     Allergies   Sulfa antibiotics   Review of Systems Review of Systems  Constitutional: Negative for fever.  Respiratory: Positive for cough. Negative for shortness of breath.   Cardiovascular: Positive for chest pain.  All other systems reviewed and are negative.    Physical Exam Updated Vital Signs BP (!) 101/57   Pulse 65   Temp 97.7 F (36.5 C) (Oral)   Resp 16   SpO2 99%   Physical Exam Physical Exam  Nursing note and vitals reviewed. Constitutional: Well developed, well nourished, non-toxic, and in no acute distress Head: Normocephalic and atraumatic.  Mouth/Throat: Oropharynx is clear and moist.  Neck: Normal range of motion. Neck supple.  Cardiovascular: Normal rate and regular rhythm.   Pulmonary/Chest: Effort normal and breath sounds normal.  Abdominal: Soft. There is no tenderness. There is no rebound and no guarding.  Musculoskeletal: Normal range of motion.  Neurological: Alert, no facial droop, fluent speech, moves all extremities symmetrically Skin: Skin is warm and dry.  Psychiatric: Cooperative   ED Treatments / Results  Labs (all labs ordered are listed, but only abnormal results are displayed) Labs Reviewed  COMPREHENSIVE METABOLIC PANEL - Abnormal; Notable for the following:       Result Value   Chloride 100 (*)    Glucose, Bld 531 (*)    Calcium 8.7 (*)    Total Protein 6.2 (*)    Total Bilirubin 1.3 (*)    All other components within normal limits  URINALYSIS, ROUTINE W REFLEX MICROSCOPIC - Abnormal; Notable for the following:    Color, Urine STRAW (*)    Glucose, UA >=500 (*)    Ketones, ur 20 (*)    Squamous Epithelial / LPF 0-5 (*)    All other components within normal limits  CBG MONITORING, ED - Abnormal; Notable for the following:     Glucose-Capillary 528 (*)    All other components within normal limits  CBG MONITORING, ED - Abnormal; Notable for the following:    Glucose-Capillary 469 (*)    All other components within normal limits  CBG MONITORING, ED - Abnormal; Notable for the following:    Glucose-Capillary 412 (*)    All other components within normal limits  CBC WITH DIFFERENTIAL/PLATELET  I-STAT TROPONIN, ED    EKG  EKG Interpretation  Date/Time:  Tuesday February 07 2017 08:50:39 EDT Ventricular Rate:  67 PR Interval:    QRS Duration: 104 QT Interval:  484 QTC Calculation: 511 R Axis:   81 Text Interpretation:  Sinus rhythm Short PR interval ST elev, probable normal early repol pattern Prolonged QT interval similar to previous EKG  Confirmed by Crista Curb [phone]) on 02/07/2017 9:02:06 AM       Radiology Dg Chest 2 View  Result Date: 02/07/2017 CLINICAL DATA:  26 year old presenting with intermittent chest pain over the past several days. EXAM: CHEST  2 VIEW COMPARISON:  07/22/2015, 10/17/2014, 11/13/2003. FINDINGS: Cardiomediastinal silhouette unremarkable, unchanged. Lungs clear. Bronchovascular markings normal. Pulmonary vascularity normal. No visible pleural effusions. No pneumothorax. Mild thoracic dextroscoliosis. No interval change. IMPRESSION: No acute cardiopulmonary disease.  Stable examination. Electronically Signed   By: Hulan Saashomas  Lawrence M.D.   On: 02/07/2017 09:21    Procedures Procedures (including critical care time)  Medications Ordered in ED Medications  sodium chloride 0.9 % bolus 1,000 mL (0 mLs Intravenous Stopped 02/07/17 0922)  sodium chloride 0.9 % bolus 1,000 mL (0 mLs Intravenous Stopped 02/07/17 1034)  insulin aspart (novoLOG) injection 10 Units (10 Units Subcutaneous Given 02/07/17 1035)     Initial Impression / Assessment and Plan / ED Course  I have reviewed the triage vital signs and the nursing notes.  Pertinent labs & imaging results that were available during my care  of the patient were reviewed by me and considered in my medical decision making (see chart for details).     Presents with atypical chest pain and hyperglycemia. Now chest pain free. Seems related to hyperglycemia as this always happens with it, but could also be cocaine use related. No concerns for PE, ACS, dissection or other serious cause at this time. EKG non-ischemic. Troponin x 1 negative. CXR visualized and clear. Glucose in 500s, but normal bicarbonate. No concerns for acute DKA. Given IVF and aspart. Feels improved. Discussed with CM, who has arranged free insulin for patient due to homelessness and unable to afford insulin. Improved glucose in ED. Felt stable for discharge home. Strict return and follow-up instructions reviewed. He expressed understanding of all discharge instructions and felt comfortable with the plan of care.    Final Clinical Impressions(s) / ED Diagnoses   Final diagnoses:  Hyperglycemia  Atypical chest pain    New Prescriptions New Prescriptions   INSULIN DETEMIR (LEVEMIR) 100 UNIT/ML INJECTION    Inject 0.3 mLs (30 Units total) into the skin at bedtime.   INSULIN REGULAR (HUMULIN R) 100 UNITS/ML INJECTION    Inject 0.5-0.75 mLs (50-75 Units total) into the skin 3 (three) times daily before meals.     Lavera GuiseLiu, Zaelyn Barbary Duo, MD 02/07/17 1228

## 2017-02-07 NOTE — Care Management Note (Signed)
Case Management Note  Patient Details  Name: Chase Benson MRN: [id number] Date of Birth: 1990/11/09  Subjective/Objective:                    Action/Plan: ED CM received consult patient is a diabetic on insulin and he is homeless. no health insurance or PCP  listed. Pt is eligible for MATCH. Discussed MATCH program and the guidelines including the  $3 co-pay per prescription patient states he is unable to afford co-pay, will wavie patient is homeless. Pt enrolled and MATCH letter printed and given to patient with list of participating pharmacies.  Pt verbalized understanding and is agreeable with plan.  [facility] and [facility] from [facility] given. Patient [facility] at 10:30am. Patient verbalized understanding teach back done. No further ED CM needs identified.    /Expected Discharge Date:02/07/2017                 / Expected Discharge Plan:  Home/Self Care  In-House Referral:     Discharge planning Services  CM Consult, MATCH Program  Post Acute Care Choice:    Choice offered to:  Patient  DME Arranged:    DME Agency:     HH Arranged:    HH Agency:     Status of Service:  Completed, signed off  If discussed at [employer] Length of Stay Meetings, dates discussed:    Additional CommentsMichel Bickers:  Chase Maybee, RN 02/07/2017, 7:26 PM

## 2017-02-07 NOTE — ED Notes (Signed)
Gave pt [place] sandwich and water upon discharge

## 2017-02-21 ENCOUNTER — Inpatient Hospital Stay (INDEPENDENT_AMBULATORY_CARE_PROVIDER_SITE_OTHER): Payer: BLUE CROSS/BLUE SHIELD | Admitting: Physician Assistant

## 2017-11-17 DIAGNOSIS — F319 Bipolar disorder, unspecified: Secondary | ICD-10-CM | POA: Diagnosis not present

## 2017-11-29 DIAGNOSIS — F4324 Adjustment disorder with disturbance of conduct: Secondary | ICD-10-CM | POA: Diagnosis not present

## 2018-05-01 ENCOUNTER — Other Ambulatory Visit: Payer: Self-pay

## 2018-05-01 ENCOUNTER — Encounter (HOSPITAL_COMMUNITY): Payer: Self-pay | Admitting: Emergency Medicine

## 2018-05-01 ENCOUNTER — Observation Stay (HOSPITAL_COMMUNITY)
Admission: EM | Admit: 2018-05-01 | Discharge: 2018-05-03 | Disposition: A | Discharge status: Home / Self Care | Payer: BLUE CROSS/BLUE SHIELD | Attending: Internal Medicine | Admitting: Internal Medicine

## 2018-05-01 ENCOUNTER — Emergency Department (HOSPITAL_COMMUNITY): Payer: BLUE CROSS/BLUE SHIELD

## 2018-05-01 DIAGNOSIS — R0902 Hypoxemia: Secondary | ICD-10-CM | POA: Diagnosis not present

## 2018-05-01 DIAGNOSIS — Z79899 Other long term (current) drug therapy: Secondary | ICD-10-CM | POA: Insufficient documentation

## 2018-05-01 DIAGNOSIS — E109 Type 1 diabetes mellitus without complications: Secondary | ICD-10-CM | POA: Insufficient documentation

## 2018-05-01 DIAGNOSIS — J984 Other disorders of lung: Secondary | ICD-10-CM | POA: Diagnosis not present

## 2018-05-01 DIAGNOSIS — R000 Tachycardia, unspecified: Secondary | ICD-10-CM | POA: Diagnosis not present

## 2018-05-01 DIAGNOSIS — J690 Pneumonitis due to inhalation of food and vomit: Secondary | ICD-10-CM | POA: Diagnosis not present

## 2018-05-01 DIAGNOSIS — E1065 Type 1 diabetes mellitus with hyperglycemia: Secondary | ICD-10-CM

## 2018-05-01 DIAGNOSIS — J9601 Acute respiratory failure with hypoxia: Secondary | ICD-10-CM | POA: Diagnosis present

## 2018-05-01 DIAGNOSIS — Z794 Long term (current) use of insulin: Secondary | ICD-10-CM | POA: Insufficient documentation

## 2018-05-01 DIAGNOSIS — E1165 Type 2 diabetes mellitus with hyperglycemia: Secondary | ICD-10-CM | POA: Diagnosis not present

## 2018-05-01 DIAGNOSIS — R042 Hemoptysis: Secondary | ICD-10-CM

## 2018-05-01 DIAGNOSIS — R4020 Unspecified coma: Secondary | ICD-10-CM | POA: Diagnosis not present

## 2018-05-01 DIAGNOSIS — T401X1A Poisoning by heroin, accidental (unintentional), initial encounter: Secondary | ICD-10-CM

## 2018-05-01 DIAGNOSIS — R404 Transient alteration of awareness: Secondary | ICD-10-CM | POA: Diagnosis not present

## 2018-05-01 DIAGNOSIS — J9621 Acute and chronic respiratory failure with hypoxia: Secondary | ICD-10-CM | POA: Diagnosis not present

## 2018-05-01 DIAGNOSIS — F17210 Nicotine dependence, cigarettes, uncomplicated: Secondary | ICD-10-CM | POA: Diagnosis not present

## 2018-05-01 DIAGNOSIS — F329 Major depressive disorder, single episode, unspecified: Secondary | ICD-10-CM | POA: Insufficient documentation

## 2018-05-01 DIAGNOSIS — J9600 Acute respiratory failure, unspecified whether with hypoxia or hypercapnia: Secondary | ICD-10-CM | POA: Diagnosis present

## 2018-05-01 HISTORY — DX: Opioid use, unspecified, uncomplicated: F11.90

## 2018-05-01 LAB — INFLUENZA PANEL BY PCR (TYPE A & B)
INFLAPCR: NEGATIVE
INFLBPCR: NEGATIVE

## 2018-05-01 LAB — D-DIMER, QUANTITATIVE: D-Dimer, Quant: 2.13 ug/mL-FEU — ABNORMAL HIGH (ref 0.00–0.50)

## 2018-05-01 LAB — BASIC METABOLIC PANEL
ANION GAP: 8 (ref 5–15)
BUN: 11 mg/dL (ref 6–20)
CALCIUM: 8.9 mg/dL (ref 8.9–10.3)
CO2: 28 mmol/L (ref 22–32)
CREATININE: 0.9 mg/dL (ref 0.61–1.24)
Chloride: 103 mmol/L (ref 98–111)
GFR calc Af Amer: >60 mL/min (ref >60)
Glucose, Bld: 153 mg/dL — ABNORMAL HIGH (ref 70–99)
Potassium: 3.7 mmol/L (ref 3.5–5.1)
Sodium: 139 mmol/L (ref 135–145)

## 2018-05-01 LAB — CBC WITH DIFFERENTIAL/PLATELET
Abs Immature Granulocytes: 0.09 10*3/uL — ABNORMAL HIGH (ref 0.00–0.07)
BASOS ABS: 0 10*3/uL (ref 0.0–0.1)
BASOS PCT: 0 %
EOS ABS: 0 10*3/uL (ref 0.0–0.5)
EOS PCT: 0 %
HEMATOCRIT: 45.9 % (ref 39.0–52.0)
HEMOGLOBIN: 14.8 g/dL (ref 13.0–17.0)
Immature Granulocytes: 1 %
Lymphocytes Relative: 5 %
Lymphs Abs: 0.8 10*3/uL (ref 0.7–4.0)
MCH: 31 pg (ref 26.0–34.0)
MCHC: 32.2 g/dL (ref 30.0–36.0)
MCV: 96 fL (ref 80.0–100.0)
MONOS PCT: 4 %
Monocytes Absolute: 0.7 10*3/uL (ref 0.1–1.0)
NEUTROS PCT: 90 %
NRBC: 0 % (ref 0.0–0.2)
Neutro Abs: 16 10*3/uL — ABNORMAL HIGH (ref 1.7–7.7)
Platelets: 284 10*3/uL (ref 150–400)
RBC: 4.78 MIL/uL (ref 4.22–5.81)
RDW: 12.6 % (ref 11.5–15.5)
WBC MORPHOLOGY: INCREASED
WBC: 17.6 10*3/uL — ABNORMAL HIGH (ref 4.0–10.5)

## 2018-05-01 LAB — TROPONIN I

## 2018-05-01 MED ORDER — ESCITALOPRAM OXALATE 10 MG PO TABS
20.0000 mg | ORAL_TABLET | Freq: Every day | ORAL | Status: DC
  Filled 2018-05-01: qty 2

## 2018-05-01 MED ORDER — SODIUM CHLORIDE 0.9 % IV SOLN
INTRAVENOUS | Status: AC
  Administered 2018-05-01: 22:00:00 via INTRAVENOUS

## 2018-05-01 MED ORDER — LEVOTHYROXINE SODIUM 100 MCG PO TABS
100.0000 ug | ORAL_TABLET | Freq: Every day | ORAL | Status: DC
  Administered 2018-05-02 – 2018-05-03 (×2): 100 ug via ORAL
  Filled 2018-05-01 (×2): qty 1

## 2018-05-01 MED ORDER — INSULIN ASPART 100 UNIT/ML ~~LOC~~ SOLN
0.0000 [IU] | Freq: Three times a day (TID) | SUBCUTANEOUS | Status: DC
  Administered 2018-05-02: 3 [IU] via SUBCUTANEOUS
  Administered 2018-05-02: 5 [IU] via SUBCUTANEOUS
  Administered 2018-05-02: 9 [IU] via SUBCUTANEOUS
  Administered 2018-05-03 (×2): 3 [IU] via SUBCUTANEOUS

## 2018-05-01 MED ORDER — SODIUM CHLORIDE 0.9% FLUSH: 3.0000 mL | INTRAVENOUS | Status: DC | PRN

## 2018-05-01 MED ORDER — SODIUM CHLORIDE 0.9% FLUSH
3.0000 mL | Freq: Two times a day (BID) | INTRAVENOUS | Status: DC
  Administered 2018-05-03 (×2): 3 mL via INTRAVENOUS

## 2018-05-01 MED ORDER — SODIUM CHLORIDE 0.9 % IV SOLN
500.0000 mg | INTRAVENOUS | Status: DC
  Filled 2018-05-01: qty 500

## 2018-05-01 MED ORDER — INSULIN ASPART 100 UNIT/ML ~~LOC~~ SOLN
3.0000 [IU] | Freq: Three times a day (TID) | SUBCUTANEOUS | Status: DC
  Administered 2018-05-02 – 2018-05-03 (×4): 3 [IU] via SUBCUTANEOUS

## 2018-05-01 MED ORDER — INSULIN ASPART 100 UNIT/ML ~~LOC~~ SOLN: 0.0000 [IU] | Freq: Every day | SUBCUTANEOUS | Status: DC

## 2018-05-01 MED ORDER — SODIUM CHLORIDE 0.9 % IV SOLN: 250.0000 mL | INTRAVENOUS | Status: DC | PRN

## 2018-05-01 MED ORDER — SODIUM CHLORIDE 0.9 % IV SOLN
1.0000 g | INTRAVENOUS | Status: DC
  Administered 2018-05-02: 1 g via INTRAVENOUS
  Filled 2018-05-01 (×2): qty 10

## 2018-05-01 MED ORDER — AZITHROMYCIN 500 MG IV SOLR
500.0000 mg | Freq: Once | INTRAVENOUS | Status: AC
  Administered 2018-05-01: 500 mg via INTRAVENOUS
  Filled 2018-05-01: qty 500

## 2018-05-01 MED ORDER — INSULIN GLARGINE 100 UNIT/ML ~~LOC~~ SOLN
34.0000 [IU] | Freq: Every day | SUBCUTANEOUS | Status: DC
  Filled 2018-05-01 (×2): qty 0.34

## 2018-05-01 NOTE — ED Notes (Signed)
Pt is spitting up blood - Unsure if from compressions or from snorting of heroin   SO to bedside   Call to [employer] CPS for child safety check

## 2018-05-01 NOTE — ED Triage Notes (Addendum)
EMS called out for cardiac arrest. Upon EMS arrival police were doing chest compressions. Pt was cyanotic. Pt given 8mg  total narcan. Pt A&O X 4. Pt stated he was snorting heroine tonight.

## 2018-05-01 NOTE — ED Notes (Addendum)
Snorted heroin today "earlier" arrest at home call to EMS chest compressions Narcan with return of respirations, and cognizance Pt used at home  lives with SO, and 2 children age 28,74yr old  He is adamant that he does not require detox because "I'm not that kind of user"  Reality therapy Rivka Barbara) he will use again, if use same amt or more possibly will arrest again and should consider his actions for his health, his children/family having to witness  He complains of CP from compressions and N as well as cold O2 sat is 88 per cent - O2 at 2 L applied  [employer] dect in to speak with pt and he is uncooperative stating he does not know anything

## 2018-05-01 NOTE — ED Notes (Signed)
Pt vomiting and complains of cold

## 2018-05-01 NOTE — ED Notes (Signed)
CPS Sharon Seller called back and situation discussed with her regarding children

## 2018-05-01 NOTE — ED Notes (Signed)
Pt brother Fredrik Cove here to see  Pt does not wish to see  Brother informed and encouraged to go home   To room to inform and pt SO on phone with another saying that she will go to smoke that she might speak

## 2018-05-01 NOTE — H&P (Signed)
TRH H&P   Patient Demographics:    Chase Benson, is a 27 y.o. male  MRN: [id number]   DOB - 04/27/1991  Admit Date - 05/01/2018  Outpatient Primary MD for the patient is Gerda Diss Vilinda Blanks, MD  Referring MD/NP/PA:   Gerhard Munch  Outpatient Specialists:     Patient coming from: home  Chief Complaint  Patient presents with  . Drug Overdose      HPI:    Chase Benson  is a 27 y.o. male, w Dm1, Hypothyroidism apparently presents with heroin overdose and dyspnea and c/o hemoptysis. EMS apparently went out to house for cardiac arrest, due to snorting heroin ?.  Pt denies coccaine, marijunana, etoh use or other illicit substance use. Pt denies suicidal ideation.  Pox 88% RA  In Ed,  T 97.9  P 96 (92-125),  R 18, (14-24),  Pox 96% on 2L [place]  CXR IMPRESSION: Diffuse infiltrative density throughout the left lung. Given the patient's clinical history of hemoptysis, this may represent some alveolar hemorrhage.  Wbc pending, Hgb 14.8, Plt 284  BMP not back yet  Pt will be admitted for acute hypoxic respiratory failure secondary to heroin use, ? Pneumonia and also cardiac arrest.        Review of systems:    In addition to the HPI above,   No Fever-chills, No Headache, No changes with Vision or hearing, No problems swallowing food or Liquids, No Chest pain, Cough or Shortness of Breath, No Abdominal pain, No Nausea or Vommitting, Bowel movements are regular, No Blood in stool or Urine, No dysuria, No new skin rashes or bruises, No new joints pains-aches,  No new weakness, tingling, numbness in any extremity, No recent weight gain or loss, No polyuria, polydypsia or polyphagia, No significant Mental Stressors.  A full 10 point Review of Systems was done, except as stated above, all other Review of Systems were negative.   With Past History of the following :      Past Medical History:  Diagnosis Date  . ADHD (attention deficit hyperactivity disorder)   . Allergy   . Diabetes mellitus    Type 1  . Heroin use   . Thyroid disease       History reviewed. No pertinent surgical history.    Social History:     Social History   Tobacco Use  . Smoking status: Current Every Day Smoker    Types: Cigarettes  . Smokeless tobacco: Never Used  Substance Use Topics  . Alcohol use: Yes     Lives - at home  Mobility - walks by self   Family History :     Family History  Problem Relation Age of Onset  . Diabetes Mother   . Diabetes Father        Home Medications:   Prior to Admission medications   Medication Sig Start Date End  Date Taking? Authorizing Provider  escitalopram (LEXAPRO) 20 MG tablet Take 1 tablet (20 mg total) by mouth daily. 10/13/16   Merlyn Albert, MD  HYDROcodone-acetaminophen (NORCO/VICODIN) 5-325 MG tablet Take 1 tablet by mouth every 4 (four) hours as needed for moderate pain (Must last 30 days.  Do not take and drive a car or use machinery.). Patient not taking: Reported on 10/13/2016 11/04/15   Darreld Mclean, MD  insulin aspart (NOVOLOG FLEXPEN) 100 UNIT/ML FlexPen     [provider]  insulin detemir (LEVEMIR) 100 UNIT/ML injection Inject into the skin at bedtime.    [provider]  insulin detemir (LEVEMIR) 100 UNIT/ML injection Inject 0.3 mLs (30 Units total) into the skin at bedtime. 02/07/17   Lavera Guise, MD  insulin glargine (LANTUS) 100 UNIT/ML injection Reported on 09/18/2015    [provider]  insulin glargine (LANTUS) 100 UNIT/ML injection Inject 0.34 mLs (34 Units total) into the skin at bedtime. 09/18/15 09/17/16  Merlyn Albert, MD  insulin regular (HUMULIN R) 100 units/mL injection Inject 0.5-0.75 mLs (50-75 Units total) into the skin 3 (three) times daily before meals. 02/07/17   Lavera Guise, MD  insulin regular (NOVOLIN R,HUMULIN R) 100 units/mL injection Inject  0.5-0.75 mLs (50-75 Units total) into the skin 3 (three) times daily before meals. As directed per sliding scale. 09/18/15   Merlyn Albert, MD  levothyroxine (SYNTHROID, LEVOTHROID) 100 MCG tablet Take 1 tablet (100 mcg total) by mouth daily before breakfast. 09/18/15   Merlyn Albert, MD     Allergies:     Allergies  Allergen Reactions  . Sulfa Antibiotics Hives     Physical Exam:   Vitals  Pulse 96, temperature 97.9 F (36.6 C), temperature source Oral, resp. rate 18, height 6\' 1"  (1.854 m), weight 81.4 kg, SpO2 96 %.   1. General  lying in bed in NAD,  On o2 [place]  2. Normal affect and insight, Not Suicidal or Homicidal, Awake Alert, Oriented X 3.  3. No F.N deficits, ALL C.Nerves Intact, Strength 5/5 all 4 extremities, Sensation intact all 4 extremities, Plantars down going.  4. Ears and Eyes appear Normal, Conjunctivae clear, PERRLA. Moist Oral Mucosa.  5. Supple Neck, No JVD, No cervical lymphadenopathy appriciated, No Carotid Bruits.  6. Symmetrical Chest wall movement,crackles left lung base, no wheezing.   7. RRR, No Gallops, Rubs or Murmurs, No Parasternal Heave.  8. Positive Bowel Sounds, Abdomen Soft, No tenderness, No organomegaly appriciated,No rebound -guarding or rigidity.  9.  No Cyanosis, Normal Skin Turgor, No Skin Rash or Bruise.  10. Good muscle tone,  joints appear normal , no effusions, Normal ROM.  11. No Palpable Lymph Nodes in Neck or Axillae    Data Review:    CBC Recent Labs  Lab 05/01/18 2106  WBC PENDING  HGB 14.8  HCT 45.9  PLT 284  MCV 96.0  MCH 31.0  MCHC 32.2  RDW 12.6  LYMPHSABS PENDING  MONOABS PENDING  EOSABS PENDING  BASOSABS PENDING   ------------------------------------------------------------------------------------------------------------------  Chemistries  No results for input(s): NA, K, CL, CO2, GLUCOSE, BUN, CREATININE, CALCIUM, MG, AST, ALT, ALKPHOS, BILITOT in the last 168 hours.  Invalid input(s):  GFRCGP ------------------------------------------------------------------------------------------------------------------ CrCl cannot be calculated (Patient's most recent lab result is older than the maximum 21 days allowed.). ------------------------------------------------------------------------------------------------------------------ No results for input(s): TSH, T4TOTAL, T3FREE, THYROIDAB in the last 72 hours.  Invalid input(s): FREET3  Coagulation profile No results for input(s): INR, PROTIME in the  last 168 hours. ------------------------------------------------------------------------------------------------------------------- No results for input(s): DDIMER in the last 72 hours. -------------------------------------------------------------------------------------------------------------------  Cardiac Enzymes No results for input(s): CKMB, TROPONINI, MYOGLOBIN in the last 168 hours.  Invalid input(s): CK ------------------------------------------------------------------------------------------------------------------ No results found for: BNP   ---------------------------------------------------------------------------------------------------------------  Urinalysis    Component Value Date/Time   COLORURINE STRAW (A) 02/07/2017 1131   APPEARANCEUR CLEAR 02/07/2017 1131   LABSPEC 1.029 02/07/2017 1131   PHURINE 5.0 02/07/2017 1131   GLUCOSEU >=500 (A) 02/07/2017 1131   HGBUR NEGATIVE 02/07/2017 1131   BILIRUBINUR NEGATIVE 02/07/2017 1131   KETONESUR 20 (A) 02/07/2017 1131   PROTEINUR NEGATIVE 02/07/2017 1131   UROBILINOGEN 0.2 10/17/2014 1158   NITRITE NEGATIVE 02/07/2017 1131   LEUKOCYTESUR NEGATIVE 02/07/2017 1131    ----------------------------------------------------------------------------------------------------------------   Imaging Results:    Dg Chest 2 View  Result Date: 05/01/2018 CLINICAL DATA:  Chest pain following CPR, hemoptysis EXAM: CHEST - 2  VIEW COMPARISON:  02/07/2017 FINDINGS: Cardiac shadow is within normal limits. The lungs are well aerated bilaterally. Diffuse infiltrative density is noted throughout the left lung. No focal effusion or confluent infiltrate is noted. No bony abnormality is seen. IMPRESSION: Diffuse infiltrative density throughout the left lung. Given the patient's clinical history of hemoptysis, this may represent some alveolar hemorrhage. Electronically Signed   By: Alcide Clever M.D.   On: 05/01/2018 20:47    ekg nsr at 95, nl axis, + lvh,    Assessment & Plan:    Principal Problem:   Acute respiratory failure with hypoxia (HCC) Active Problems:   Acute respiratory failure (HCC)    Acute respiratory failure (hypoxemic) Pneumonia (CAP vs aspiration) Blood culture x2 Sputum culture  Urine strep antigen Urine legionella antigen Influenza panel Rocephin 1gm iv qday Zithromax 500mg  iv qday  Cardiac arrest , tachycardia Tele Trop I q6h x3 Check d dimer, if positive then check CTA chest Check cardiac echo  Heroin use counselled on cessation UDS  Dm1 fsbs ac and qhs, ISS Cont lantus  Hypothyroidism Cont levothyroxine Check tsh   DVT Prophylaxis Lovenox - SCDs   AM Labs Ordered, also please review Full Orders  Family Communication: Admission, patients condition and plan of care including tests being ordered have been discussed with the patient  who indicate understanding and agree with the plan and Code Status.  Code Status  FULL CODE  Likely DC to  home  Condition GUARDED    Consults called: none  Admission status: observation,  Pt will be admitted for cardiac arrest, and acute respiratory failure secondary to heroin use as well as ? Pneumonia.  Pt requires hospitalization for IV abx.  Depending upon how fast his pneumonia improves and his cardiac w/up may need inpatient status.   Time spent in minutes : 70   Pearson Grippe M.D on 05/01/2018 at 9:39 PM  Between 7am to 7pm - Pager -  [phone]  . After 7pm go to [email or web address] - password [facility]  [facility] - Office  [phone]

## 2018-05-01 NOTE — ED Notes (Signed)
Pt wants no information shared regarding his condition

## 2018-05-01 NOTE — ED Notes (Signed)
Call to floor for report [facility] RN will call back after blood check

## 2018-05-01 NOTE — ED Notes (Signed)
Blood cultures ordered after antibiotics.

## 2018-05-01 NOTE — ED Notes (Signed)
Pt knife removed from room and given to security

## 2018-05-01 NOTE — ED Provider Notes (Signed)
[facility] EMERGENCY DEPARTMENT Provider Note   CSN: [id number] Arrival date & time: 05/01/18  1959     History   Chief Complaint Chief Complaint  Patient presents with  . Drug Overdose    HPI Chase Benson is a 27 y.o. male.  HPI Patient presents after an overdose. He notes that he snorted heroin earlier this evening, and does not recall what happened. EMS notes the patient was unresponsive on arrival, but after providing Narcan became awake and alert, and improved throughout transport here. Patient states that he is generally well aside from a history of diabetes.  He denies substance abuse issues, states that he infrequently uses heroin, or any other drugs.  The patient denies pain, dyspnea, nausea, lightheadedness. States that he is cold.  Past Medical History:  Diagnosis Date  . ADHD (attention deficit hyperactivity disorder)   . Allergy   . Diabetes mellitus    Type 1  . Thyroid disease     Patient Active Problem List   Diagnosis Date Noted  . 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 ADHD (attention deficit hyperactivity disorder) 11/02/2014  . Substance abuse (HCC) 11/02/2014  . Insulin dependent type 1 diabetes mellitus (HCC) 06/23/2013  . Hypothyroidism 06/23/2013  . Diabetic neuropathy with neurologic complication (HCC) 06/23/2013    History reviewed. No pertinent surgical history.      Home Medications    Prior to Admission medications   Medication Sig Start Date End Date Taking? Authorizing Provider  escitalopram (LEXAPRO) 20 MG tablet Take 1 tablet (20 mg total) by mouth daily. 10/13/16   Merlyn Albert, MD  HYDROcodone-acetaminophen (NORCO/VICODIN) 5-325 MG tablet Take 1 tablet by mouth every 4 (four) hours as needed for moderate pain (Must last 30 days.  Do not take and drive a car or use machinery.). Patient not taking: Reported on 10/13/2016 11/04/15   Darreld Mclean, MD  insulin aspart (NOVOLOG FLEXPEN) 100 UNIT/ML FlexPen     [provider]  insulin detemir (LEVEMIR) 100 UNIT/ML injection Inject into the skin at bedtime.    [provider]  insulin detemir (LEVEMIR) 100 UNIT/ML injection Inject 0.3 mLs (30 Units total) into the skin at bedtime. 02/07/17   Lavera Guise, MD  insulin glargine (LANTUS) 100 UNIT/ML injection Reported on 09/18/2015    [provider]  insulin glargine (LANTUS) 100 UNIT/ML injection Inject 0.34 mLs (34 Units total) into the skin at bedtime. 09/18/15 09/17/16  Merlyn Albert, MD  insulin regular (HUMULIN R) 100 units/mL injection Inject 0.5-0.75 mLs (50-75 Units total) into the skin 3 (three) times daily before meals. 02/07/17   Lavera Guise, MD  insulin regular (NOVOLIN R,HUMULIN R) 100 units/mL injection Inject 0.5-0.75 mLs (50-75 Units total) into the skin 3 (three) times daily before meals. As directed per sliding scale. 09/18/15   Merlyn Albert, MD  levothyroxine (SYNTHROID, LEVOTHROID) 100 MCG tablet Take 1 tablet (100 mcg total) by mouth daily before breakfast. 09/18/15   Merlyn Albert, MD    Family History No family history on file.  Social History Social History   Tobacco Use  . Smoking status: Current Every Day Smoker    Types: Cigarettes  . Smokeless tobacco: Never Used  Substance Use Topics  . Alcohol use: Yes  . Drug use: No     Allergies   Sulfa antibiotics   Review of Systems Review of Systems  Constitutional:       Per HPI, otherwise negative  HENT:  Per HPI, otherwise negative  Respiratory:       Per HPI, otherwise negative  Cardiovascular:       Per HPI, otherwise negative  Gastrointestinal: Negative for vomiting.  Endocrine:       Insulin-dependent diabetes  Genitourinary:       Neg aside from HPI   Musculoskeletal:       Per HPI, otherwise negative  Skin: Negative.   Neurological: Negative for syncope.     Physical Exam Updated Vital Signs Pulse (!) 106   Temp 97.9 F (36.6 C) (Oral)   Resp 14   Ht 6\' 1"  (1.854  m)   Wt 81.4 kg   SpO2 98%   BMI 23.68 kg/m   Physical Exam  Constitutional: He is oriented to person, place, and time. He appears well-developed. No distress.  HENT:  Head: Normocephalic and atraumatic.  Eyes: Conjunctivae and EOM are normal.  Cardiovascular: Regular rhythm. Tachycardia present.  Pulmonary/Chest: Tachypnea noted. He has decreased breath sounds. He has wheezes.  Abdominal: He exhibits no distension.  Musculoskeletal: He exhibits no edema.  Neurological: He is alert and oriented to person, place, and time.  Skin: Skin is warm and dry.  Psychiatric: He has a normal mood and affect.  Nursing note and vitals reviewed.    ED Treatments / Results   Procedures Procedures (including critical care time)   EKG Interpretation  Date/Time:  Tuesday May 01 2018 20:17:30 EDT Ventricular Rate:  96 PR Interval:    QRS Duration: 79 QT Interval:  380 QTC Calculation: 481 R Axis:   93 Text Interpretation:  Sinus rhythm Right atrial enlargement Borderline right axis deviation T wave abnormality No significant change since last tracing Abnormal ekg Confirmed by Gerhard Munch [phone]) on 05/01/2018 8:20:19 PM        Initial Impression / Assessment and Plan / ED Course  I have reviewed the triage vital signs and the nursing notes.  Pertinent labs & imaging results that were available during my care of the patient were reviewed by me and considered in my medical decision making (see chart for details).  Young male presents after overdose. Patient acknowledges using heroin prior to becoming unresponsive, and improved following provision of Narcan in the field.   On monitor the patient has no hypoxia, no hypotension, and on repeat exam has no distress.  He does have a cough, with hemoptysis. He did have some chest tightness, but EKG is unchanged from prior, without ischemic findings.  Chest x-ray consistent with concern for aspiration given the patient's tachypnea,  tachycardia, hemoptysis. She now requires 2 L nasal cannula to maintain saturation 95%, he was hypoxic on room air. Given concern for possible aspiration, new oxygen requirement, hypoxia, after overdose, patient will require admission for further evaluation and management.   Final Clinical Impressions(s) / ED Diagnoses  Overdose, initial encounter Hypoxia Hemoptysis   Gerhard Munch, MD 05/01/18 2109

## 2018-05-01 NOTE — ED Notes (Signed)
[place], RN received report

## 2018-05-01 NOTE — ED Notes (Signed)
Dr Selena Batten in to speak with pt

## 2018-05-02 ENCOUNTER — Observation Stay (HOSPITAL_BASED_OUTPATIENT_CLINIC_OR_DEPARTMENT_OTHER): Payer: BLUE CROSS/BLUE SHIELD

## 2018-05-02 ENCOUNTER — Observation Stay (HOSPITAL_COMMUNITY): Payer: BLUE CROSS/BLUE SHIELD

## 2018-05-02 DIAGNOSIS — I469 Cardiac arrest, cause unspecified: Secondary | ICD-10-CM

## 2018-05-02 DIAGNOSIS — E039 Hypothyroidism, unspecified: Secondary | ICD-10-CM

## 2018-05-02 DIAGNOSIS — E1065 Type 1 diabetes mellitus with hyperglycemia: Secondary | ICD-10-CM

## 2018-05-02 DIAGNOSIS — J690 Pneumonitis due to inhalation of food and vomit: Secondary | ICD-10-CM

## 2018-05-02 DIAGNOSIS — R7989 Other specified abnormal findings of blood chemistry: Secondary | ICD-10-CM | POA: Diagnosis not present

## 2018-05-02 LAB — COMPREHENSIVE METABOLIC PANEL
ALT: 66 U/L — AB (ref 0–44)
AST: 109 U/L — AB (ref 15–41)
Albumin: 3.7 g/dL (ref 3.5–5.0)
Alkaline Phosphatase: 88 U/L (ref 38–126)
Anion gap: 7 (ref 5–15)
BUN: 8 mg/dL (ref 6–20)
CALCIUM: 8.7 mg/dL — AB (ref 8.9–10.3)
CO2: 28 mmol/L (ref 22–32)
CREATININE: 0.68 mg/dL (ref 0.61–1.24)
Chloride: 103 mmol/L (ref 98–111)
GFR calc Af Amer: >60 mL/min (ref >60)
Glucose, Bld: 157 mg/dL — ABNORMAL HIGH (ref 70–99)
Potassium: 4.1 mmol/L (ref 3.5–5.1)
Sodium: 138 mmol/L (ref 135–145)
Total Bilirubin: 0.9 mg/dL (ref 0.3–1.2)
Total Protein: 6.5 g/dL (ref 6.5–8.1)

## 2018-05-02 LAB — ECHOCARDIOGRAM COMPLETE
Height: 73 in
WEIGHTICAEL: 2892.44 [oz_av]

## 2018-05-02 LAB — GLUCOSE, CAPILLARY
GLUCOSE-CAPILLARY: 182 mg/dL — AB (ref 70–99)
GLUCOSE-CAPILLARY: 59 mg/dL — AB (ref 70–99)
Glucose-Capillary: 171 mg/dL — ABNORMAL HIGH (ref 70–99)
Glucose-Capillary: 437 mg/dL — ABNORMAL HIGH (ref 70–99)
Glucose-Capillary: 63 mg/dL — ABNORMAL LOW (ref 70–99)
Glucose-Capillary: 234 mg/dL — ABNORMAL HIGH (ref 70–99)
Glucose-Capillary: 271 mg/dL — ABNORMAL HIGH (ref 70–99)

## 2018-05-02 LAB — CBC
HCT: 42.6 % (ref 39.0–52.0)
Hemoglobin: 13.8 g/dL (ref 13.0–17.0)
MCH: 31.8 pg (ref 26.0–34.0)
MCHC: 32.4 g/dL (ref 30.0–36.0)
MCV: 98.2 fL (ref 80.0–100.0)
NRBC: 0 % (ref 0.0–0.2)
Platelets: 252 10*3/uL (ref 150–400)
RBC: 4.34 MIL/uL (ref 4.22–5.81)
RDW: 12.6 % (ref 11.5–15.5)
WBC: 17.6 10*3/uL — ABNORMAL HIGH (ref 4.0–10.5)

## 2018-05-02 LAB — RAPID URINE DRUG SCREEN, HOSP PERFORMED
AMPHETAMINES: NOT DETECTED
Barbiturates: NOT DETECTED
Benzodiazepines: NOT DETECTED
Cocaine: NOT DETECTED
OPIATES: NOT DETECTED
TETRAHYDROCANNABINOL: NOT DETECTED

## 2018-05-02 LAB — STREP PNEUMONIAE URINARY ANTIGEN: STREP PNEUMO URINARY ANTIGEN: NEGATIVE

## 2018-05-02 LAB — TROPONIN I

## 2018-05-02 MED ORDER — IOPAMIDOL (ISOVUE-370) INJECTION 76%
100.0000 mL | Freq: Once | INTRAVENOUS | Status: AC | PRN
  Administered 2018-05-02: 100 mL via INTRAVENOUS

## 2018-05-02 MED ORDER — AMOXICILLIN-POT CLAVULANATE 875-125 MG PO TABS
1.0000 | ORAL_TABLET | Freq: Two times a day (BID) | ORAL | Status: DC
  Administered 2018-05-02 – 2018-05-03 (×3): 1 via ORAL
  Filled 2018-05-02 (×3): qty 1

## 2018-05-02 MED ORDER — DEXTROSE 50 % IV SOLN
1.0000 | Freq: Once | INTRAVENOUS | Status: AC
  Administered 2018-05-02: 50 mL via INTRAVENOUS

## 2018-05-02 MED ORDER — INSULIN GLARGINE 100 UNIT/ML ~~LOC~~ SOLN
25.0000 [IU] | Freq: Every day | SUBCUTANEOUS | Status: DC
  Administered 2018-05-02 – 2018-05-03 (×2): 25 [IU] via SUBCUTANEOUS
  Filled 2018-05-02 (×3): qty 0.25

## 2018-05-02 MED ORDER — DEXTROSE 50 % IV SOLN
INTRAVENOUS | Status: AC
  Filled 2018-05-02: qty 50

## 2018-05-02 NOTE — Progress Notes (Signed)
Withheld HS Lantus. CBG was 59. Pushed ampule of D50.  CBG: 171

## 2018-05-02 NOTE — Progress Notes (Signed)
PROGRESS NOTE    CHRISTROPHER GINTZ  WUJ:[id number] DOB: 1991-07-17 DOA: 05/01/2018 PCP: Merlyn Albert, MD     Brief Narrative:   27 y.o. male, w Dm1, Hypothyroidism apparently presents with heroin overdose and dyspnea and c/o hemoptysis. EMS apparently went out to house for cardiac arrest, due to snorting heroin ?.  Pt denies coccaine, marijunana, etoh use or other illicit substance use. Pt denies suicidal ideation.  Pox 88% RA  In Ed,  T 97.9  P 96 (92-125),  R 18, (14-24),  Pox 96% on 2L [place]  CXR IMPRESSION: Diffuse infiltrative density throughout the left lung. Given the patient's clinical history of hemoptysis, this may represent some alveolar hemorrhage.  Wbc 17.6, Hgb 14.8, Plt 284  Pt will be admitted for acute hypoxic respiratory failure secondary to heroin use, ? Pneumonia and also unresponsive state (non intentional overdose)   Assessment & Plan: 1-Acute respiratory failure with hypoxia (HCC): in the setting of aspiration Pneumonitis/PNA -patient with most likely aspiration after non-intentional overdose -CTA neg for PE; patterned suggesting aspiration Vs alveolar hemorrhage. 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 Family Communication: no family at bedside. Disposition Plan: observed off O2 supplementation, transition to PO antibiotics and adjust insulin.  Consultants:   None   Procedures:   2-D echo - Left ventricle: The  cavity size was normal. Wall thickness was   normal. Systolic function was normal. The estimated ejection   fraction was 60%. Wall motion was normal; there were no regional   wall motion abnormalities. Left ventricular diastolic function   parameters were normal.  Impressions: - Normal study.  Antimicrobials:  Anti-infectives (From admission, onward)   Start     Dose/Rate Route Frequency Ordered Stop   05/02/18 2100  azithromycin (ZITHROMAX) 500 mg in sodium chloride 0.9 % 250 mL IVPB  Status:  Discontinued     500 mg 250 mL/hr over 60 Minutes Intravenous Every 24 hours 05/01/18 2123 05/02/18 1217   05/02/18 1230  amoxicillin-clavulanate (AUGMENTIN) 875-125 MG per tablet 1 tablet     1 tablet Oral Every 12 hours 05/02/18 1217     05/01/18 2200  cefTRIAXone (ROCEPHIN) 1 g in sodium chloride 0.9 % 100 mL IVPB  Status:  Discontinued     1 g 200 mL/hr over 30 Minutes Intravenous Every 24 hours 05/01/18 2123 05/02/18 1217   05/01/18 2115  azithromycin (ZITHROMAX) 500 mg in sodium chloride 0.9 % 250 mL IVPB     500 mg 250 mL/hr over 60 Minutes Intravenous  Once 05/01/18 2108 05/01/18 2230       Subjective: Expressed remorse from recent drug use, reported mild chest discomfort in middle of chest, no requiring oxygen, mildly productive intermittent coughing spells.  Objective: Vitals:   05/01/18 2304 05/02/18 0553 05/02/18 1322 05/02/18 2146  BP:  101/64 112/68 127/69  Pulse:  71 75 68  Resp:   18 18  Temp:  98.5 F (36.9 C) 98.6 F (37 C) 98.2 F (36.8 C)  TempSrc:  Oral Oral Oral  SpO2:  95% 95% 94%  Weight: 82 kg     Height: 6\' 1"  (1.854 m)       Intake/Output Summary (Last 24 hours) at 05/02/2018 2245 Last data filed at 05/02/2018 1700 Gross per 24 hour  Intake 810 ml  Output 775 ml  Net 35 ml   Filed Weights   05/01/18 2002 05/01/18 2304  Weight: 81.4 kg 82 kg    Examination: General exam: Alert, awake, oriented x 3; in no major distress and with good O2 sat  on RA. Reports mild chest discomfort central in location. Respiratory system: positive rhonchi, no wheezing, no using accessory muscles. Cardiovascular system:RRR. No murmurs, rubs, gallops. Gastrointestinal system: Abdomen is nondistended, soft and nontender. No organomegaly or masses felt. Normal bowel sounds heard. Central nervous system: Alert and oriented. No focal neurological deficits. Extremities: No C/C/E, +pedal pulses Skin: No rashes, lesions or ulcers Psychiatry: Judgement and insight appear normal. Mood & affect appropriate.     Data Reviewed: I have personally reviewed following labs and imaging studies  CBC: Recent Labs  Lab 05/01/18 2106 05/02/18 0319  WBC 17.6* 17.6*  NEUTROABS 16.0*  --   HGB 14.8 13.8  HCT 45.9 42.6  MCV 96.0 98.2  PLT 284 252   Basic Metabolic Panel: Recent Labs  Lab 05/01/18 2106 05/02/18 0319  NA 139 138  K 3.7 4.1  CL 103 103  CO2 28 28  GLUCOSE 153* 157*  BUN 11 8  CREATININE 0.90 0.68  CALCIUM 8.9 8.7*   GFR: Estimated Creatinine Clearance: 155.4 mL/min (by C-G formula based on SCr of 0.68 mg/dL).   Liver Function Tests: Recent Labs  Lab 05/02/18 0319  AST 109*  ALT 66*  ALKPHOS 88  BILITOT 0.9  PROT 6.5  ALBUMIN 3.7   Cardiac Enzymes: Recent Labs  Lab 05/01/18 2106 05/02/18 0319 05/02/18 0855  TROPONINI <0.03 <0.03 <0.03   CBG: Recent Labs  Lab 05/02/18 0049 05/02/18 0724 05/02/18 1122 05/02/18 1623 05/02/18 2143  GLUCAP 171* 437* 234* 271* 182*   Urine analysis:    Component Value Date/Time   COLORURINE STRAW (A) 02/07/2017 1131   APPEARANCEUR CLEAR 02/07/2017 1131   LABSPEC 1.029 02/07/2017 1131   PHURINE 5.0 02/07/2017 1131   GLUCOSEU >=500 (A) 02/07/2017 1131   HGBUR NEGATIVE 02/07/2017 1131   BILIRUBINUR NEGATIVE 02/07/2017 1131   KETONESUR 20 (A) 02/07/2017 1131   PROTEINUR NEGATIVE 02/07/2017 1131   UROBILINOGEN 0.2 10/17/2014 1158   NITRITE NEGATIVE 02/07/2017 1131   LEUKOCYTESUR  NEGATIVE 02/07/2017 1131    Recent Results (from the past 240 hour(s))  Culture, blood (routine x 2) Call MD if unable to obtain prior to antibiotics being given     Status: None (Preliminary result)   Collection Time: 05/01/18  9:49 PM  Result Value Ref Range Status   Specimen Description BLOOD RIGHT ARM  Final   Special Requests   Final    BOTTLES DRAWN AEROBIC AND ANAEROBIC ANA BCAV AEB BCHV   Culture   Final    NO GROWTH < 24 HOURS Performed at [facility] [address] [place], [place] [address]    Report Status PENDING  Incomplete  Culture, blood (routine x 2) Call MD if unable to obtain prior to antibiotics being given     Status: None (Preliminary result)   Collection Time: 05/01/18  9:49 PM  Result Value Ref Range Status   Specimen Description BLOOD RIGHT WRIST  Final   Special Requests   Final    BOTTLES DRAWN AEROBIC  AND ANAEROBIC ANA BCAV AEB BCHV   Culture   Final    NO GROWTH < 24 HOURS Performed at [facility], [address] [place], [place] [address]    Report Status PENDING  Incomplete  Culture, respiratory     Status: None (Preliminary result)   Collection Time: 05/02/18 11:35 AM  Result Value Ref Range Status   Specimen Description EXPECTORATED SPUTUM  Final   Special Requests   Final    NONE Performed at [facility], [address] [place], [place] [address]    Gram Stain   Final    FEW WBC PRESENT, PREDOMINANTLY PMN RARE SQUAMOUS EPITHELIAL CELLS PRESENT FEW GRAM POSITIVE RODS RARE GRAM NEGATIVE RODS RARE GRAM POSITIVE COCCI Performed at [facility], [address] [place], [place] [address]    Culture PENDING  Incomplete   Report Status PENDING  Incomplete     Radiology Studies: Dg Chest 2 View  Result Date: 05/01/2018 CLINICAL DATA:  Chest pain following CPR, hemoptysis EXAM: CHEST - 2 VIEW COMPARISON:  02/07/2017 FINDINGS: Cardiac shadow is within normal limits. The lungs are well aerated bilaterally. Diffuse infiltrative density  is noted throughout the left lung. No focal effusion or confluent infiltrate is noted. No bony abnormality is seen. IMPRESSION: Diffuse infiltrative density throughout the left lung. Given the patient's clinical history of hemoptysis, this may represent some alveolar hemorrhage. Electronically Signed   By: Alcide Clever M.D.   On: 05/01/2018 20:47   Ct Angio Chest Pe W Or Wo Contrast  Result Date: 05/02/2018 CLINICAL DATA:  Hemoptysis with elevated D-dimer. Diabetes. Presents with heroin overdose and dyspnea. EXAM: CT ANGIOGRAPHY CHEST WITH CONTRAST TECHNIQUE: Multidetector CT imaging of the chest was performed using the standard protocol during bolus administration of intravenous contrast. Multiplanar CT image reconstructions and MIPs were obtained to evaluate the vascular anatomy. CONTRAST:  ISOVUE-370 IOPAMIDOL (ISOVUE-370) INJECTION 76% COMPARISON:  None. FINDINGS: Cardiovascular: There is moderately good opacification of the central and segmental pulmonary arteries. No focal filling defects. No evidence of significant pulmonary embolus. Normal caliber thoracic aorta. Great vessel origins are patent. No evidence of dissection. Normal heart size. No pericardial effusions. Mediastinum/Nodes: Esophagus is decompressed. No significant lymphadenopathy in the chest. Lungs/Pleura: Diffuse alveolar infiltrates throughout the left lung with patchy alveolar infiltrates throughout the right lung. Appearances are nonspecific in could represent edema, multifocal pneumonia, aspiration, or alveolar hemorrhage. Airways are patent. No pleural effusions. No pneumothorax. Upper Abdomen: No acute abnormality. Musculoskeletal: No chest wall abnormality. No acute or significant osseous findings. Review of the MIP images confirms the above findings. IMPRESSION: 1. No evidence of significant pulmonary embolus. 2. Diffuse alveolar infiltrates throughout both lungs, greater on the left. Differential diagnosis includes edema,  multifocal pneumonia, aspiration, or alveolar hemorrhage. Electronically Signed   By: Burman Nieves M.D.   On: 05/02/2018 01:26        Scheduled Meds: . amoxicillin-clavulanate  1 tablet Oral Q12H  . escitalopram  20 mg Oral Daily  . insulin aspart  0-5 Units Subcutaneous QHS  . insulin aspart  0-9 Units Subcutaneous TID WC  . insulin aspart  3 Units Subcutaneous TID WC  . insulin glargine  25 Units Subcutaneous Daily  . levothyroxine  100 mcg Oral QAC breakfast  . sodium chloride flush  3 mL Intravenous Q12H   Continuous Infusions: . sodium chloride       LOS: 0 days    Time spent: 30 minutes   Vassie Loll, MD [facility] Pager [phone]  If 7PM-7AM, please contact night-coverage [email or web address] Password [email or web address] 05/02/2018, 10:45 PM

## 2018-05-02 NOTE — Progress Notes (Signed)
*  PRELIMINARY RESULTS* Echocardiogram 2D Echocardiogram has been performed.  Stacey Drain 05/02/2018, 11:19 AM

## 2018-05-03 DIAGNOSIS — R042 Hemoptysis: Secondary | ICD-10-CM

## 2018-05-03 LAB — CBC
HCT: 44.5 % (ref 39.0–52.0)
Hemoglobin: 14.4 g/dL (ref 13.0–17.0)
MCH: 32 pg (ref 26.0–34.0)
MCHC: 32.4 g/dL (ref 30.0–36.0)
MCV: 98.9 fL (ref 80.0–100.0)
NRBC: 0 % (ref 0.0–0.2)
PLATELETS: 242 10*3/uL (ref 150–400)
RBC: 4.5 MIL/uL (ref 4.22–5.81)
RDW: 12.5 % (ref 11.5–15.5)
WBC: 9.2 10*3/uL (ref 4.0–10.5)

## 2018-05-03 LAB — HIV ANTIBODY (ROUTINE TESTING W REFLEX): HIV SCREEN 4TH GENERATION: NONREACTIVE

## 2018-05-03 LAB — BASIC METABOLIC PANEL
ANION GAP: 9 (ref 5–15)
BUN: 10 mg/dL (ref 6–20)
CO2: 27 mmol/L (ref 22–32)
CREATININE: 0.85 mg/dL (ref 0.61–1.24)
Calcium: 9 mg/dL (ref 8.9–10.3)
Chloride: 102 mmol/L (ref 98–111)
GLUCOSE: 202 mg/dL — AB (ref 70–99)
Potassium: 4 mmol/L (ref 3.5–5.1)
Sodium: 138 mmol/L (ref 135–145)

## 2018-05-03 LAB — GLUCOSE, CAPILLARY
GLUCOSE-CAPILLARY: 240 mg/dL — AB (ref 70–99)
Glucose-Capillary: 234 mg/dL — ABNORMAL HIGH (ref 70–99)

## 2018-05-03 LAB — LEGIONELLA PNEUMOPHILA SEROGP 1 UR AG: L. pneumophila Serogp 1 Ur Ag: NEGATIVE

## 2018-05-03 MED ORDER — AMOXICILLIN-POT CLAVULANATE 875-125 MG PO TABS: 1.0000 | ORAL_TABLET | Freq: Two times a day (BID) | ORAL | 0 refills | Status: AC

## 2018-05-03 MED ORDER — ESCITALOPRAM OXALATE 20 MG PO TABS: 20.0000 mg | ORAL_TABLET | Freq: Every day | ORAL | 2 refills | Status: AC

## 2018-05-03 MED ORDER — INSULIN DETEMIR 100 UNIT/ML ~~LOC~~ SOLN: 18.0000 [IU] | Freq: Two times a day (BID) | SUBCUTANEOUS | 3 refills | Status: AC

## 2018-05-03 MED ORDER — INFLUENZA VAC SPLIT QUAD 0.5 ML IM SUSY: 0.5000 mL | PREFILLED_SYRINGE | INTRAMUSCULAR | Status: DC

## 2018-05-03 NOTE — Care Management (Signed)
CM discussed results of benefits check with patient and he will be able to afford cost of insulins. He uses Psychologist, forensic.

## 2018-05-03 NOTE — Progress Notes (Signed)
Inpatient Diabetes Program Recommendations  AACE/ADA: New Consensus Statement on Inpatient Glycemic Control (2015)  Target Ranges:  Prepandial:   less than 140 mg/dL      Peak postprandial:   less than 180 mg/dL (1-2 hours)      Critically ill patients:  140 - 180 mg/dL   Results for MALAKYE, NOLDEN (MRN [id number]) as of 05/03/2018 07:53  Ref. Range 05/02/2018 07:24 05/02/2018 11:22 05/02/2018 16:23 05/02/2018 21:43  Glucose-Capillary Latest Ref Range: 70 - 99 mg/dL 409 (H)  12 units NOVOLOG  234 (H)  3 units NOVOLOG +  25 units LANTUS  271 (H)  8 units NOVOLOG  182 (H)   Results for KARRON, ALVIZO (MRN [id number]) as of 05/03/2018 07:53  Ref. Range 05/03/2018 07:28  Glucose-Capillary Latest Ref Range: 70 - 99 mg/dL 956 (H)    Home DM Meds: Lantus 34 units QHS        Regular Insulin 50-75 units TID   Current Orders: Lantus 25 units Daily        Novolog Sensitive Correction Scale/ SSI (0-9 units) TID AC + HS      Novolog 3 units TID with meals      MD- Please consider the following in-hospital insulin adjustments:  1. Increase Lantus to 30 units Daily  2. Increase Novolog Meal Coverage to: Novolog 6 units TID with meals      --Will follow patient during hospitalization--  Ambrose Finland RN, MSN, CDE Diabetes Coordinator Inpatient Glycemic Control Team Team Pager: [phone] (8a-5p)

## 2018-05-03 NOTE — Discharge Summary (Signed)
Physician Discharge Summary  Chase Benson HYQ:[id number] DOB: Oct 17, 1990 DOA: 05/01/2018  PCP: Merlyn Albert, MD  Admit date: 05/01/2018 Discharge date: 05/03/2018  Time spent: 35 minutes  Recommendations for Outpatient Follow-up:  Repeat CBC to follow WBC's trend Repeat CXR in 4-6 weeks to assure resolution of infiltrates. Close follow up to CBG's/A1C and adjust hypoglycemic regimen as needed. Assist patient as needed with heroin abuse cessation  Discharge Diagnoses:  Acute respiratory failure with hypoxia (HCC) Aspiration PNA/Pneumonitis Heroin overdose Uncontrolled type 1 diabetes Depression    Discharge Condition: stable and improved. Discharge home with instructions to follow up with PCP in 10 days and to follow up with endocrinologist as an outpatient.  Diet recommendation: modified carb diet.  Filed Weights   05/01/18 2002 05/01/18 2304  Weight: 81.4 kg 82 kg    History of present illness:  As per H&P written by Dr. Selena Batten on 05/01/18 27 y.o.male,w Dm1, Hypothyroidism apparently presents with heroin overdose and dyspnea and c/o hemoptysis.EMS apparently went out to house for cardiac arrest, due to snorting heroin ?. Pt denies coccaine, marijunana, etoh use or other illicit substance use. Pt denies suicidal ideation. Pox 88% RA  In Ed, T 97.9 P 96 (92-125), R 18, (14-24), Pox 96% on 2L [place]  CXR IMPRESSION: Diffuse infiltrative density throughout the left lung. Given the patient's clinical history of hemoptysis, this may represent some alveolar hemorrhage.  Wbc 17.6, Hgb 14.8, Plt 284  Pt will be admitted for acute hypoxic respiratory failure secondary to heroin use, ? Pneumonia and also unresponsive state (non intentional overdose)   Hospital Course:  1-Acute respiratory failure with hypoxia (HCC): in the setting of aspiration Pneumonitis/PNA -patient with most likely aspiration after non-intentional overdose -CTA neg for PE; patterned  suggesting aspiration Vs alveolar hemorrhage. -no further hemoptysis reported. -afebrile and off O2 currently  -will discharge home on augmentin to complete antibiotics therapy. -troponin neg, EKG w/o ischemia, telmetry w/o abnormalities. 2-D echo reassuring.  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  3-uncontrolled type 1 diabetes -patient reported difficulty acquiring long insulin at times -continue SSI and will discharge on levemir 18 units BID -follow up with PCP and his endocrinologist for further adjustment as needed -continue modified carb diet  4-hypothyroidism -continue synthroid   5-depression -no SI or hallucinations -continue lexapro  Procedures:  2-D echo - Left ventricle: The cavity size was normal. Wall thickness was normal. Systolic function was normal. The estimated ejection fraction was 60%. Wall motion was normal; there were no regional wall motion abnormalities. Left ventricular diastolic function parameters were normal.  Impressions: - Normal study.  Consultations:  None   Discharge Exam: Vitals:   05/02/18 2200 05/03/18 0631  BP:  122/69  Pulse:  66  Resp:    Temp:  98.5 F (36.9 C)  SpO2: 95% 95%    General: afebrile, no CP, no SOB, no further hemoptysis; feeling better. Cardiovascular: S1 and S2, no rubs, no gallops Respiratory: positive rhonchi, no wheezing, good O2 sat on RA. Abd: soft, NT, ND, positive BS Extremities: no edema, no cyanosis.  Discharge Instructions   Discharge Instructions    Diet Carb Modified   Complete by:  As directed    Discharge instructions   Complete by:  As directed    Stop recreational drugs Take medications as prescribed Keep yourself well-hydrated Follow modified carbohydrates diet Arrange follow-up with PCP and endocrinologist in 10  days.     Allergies as of  05/03/2018      Reactions   Sulfa Antibiotics Hives      Medication List    STOP taking these medications   insulin glargine 100 UNIT/ML injection Commonly known as:  LANTUS     TAKE these medications   amoxicillin-clavulanate 875-125 MG tablet Commonly known as:  AUGMENTIN Take 1 tablet by mouth every 12 (twelve) hours for 7 days.   escitalopram 20 MG tablet Commonly known as:  LEXAPRO Take 1 tablet (20 mg total) by mouth daily.   insulin detemir 100 UNIT/ML injection Commonly known as:  LEVEMIR Inject 0.18 mLs (18 Units total) into the skin 2 (two) times daily.   insulin regular 100 units/mL injection Commonly known as:  NOVOLIN R,HUMULIN R Inject 0.5-0.75 mLs (50-75 Units total) into the skin 3 (three) times daily before meals. As directed per sliding scale.   insulin regular 100 units/mL injection Commonly known as:  NOVOLIN R,HUMULIN R Inject 0.5-0.75 mLs (50-75 Units total) into the skin 3 (three) times daily before meals.   levothyroxine 100 MCG tablet Commonly known as:  SYNTHROID, LEVOTHROID Take 1 tablet (100 mcg total) by mouth daily before breakfast.      Allergies  Allergen Reactions  . Sulfa Antibiotics Hives    The results of significant diagnostics from this hospitalization (including imaging, microbiology, ancillary and laboratory) are listed below for reference.    Significant Diagnostic Studies: Dg Chest 2 View  Result Date: 05/01/2018 CLINICAL DATA:  Chest pain following CPR, hemoptysis EXAM: CHEST - 2 VIEW COMPARISON:  02/07/2017 FINDINGS: Cardiac shadow is within normal limits. The lungs are well aerated bilaterally. Diffuse infiltrative density is noted throughout the left lung. No focal effusion or confluent infiltrate is noted. No bony abnormality is seen. IMPRESSION: Diffuse infiltrative density throughout the left lung. Given the patient's clinical history of hemoptysis, this may represent some alveolar hemorrhage. Electronically Signed    By: Alcide Clever M.D.   On: 05/01/2018 20:47   Ct Angio Chest Pe W Or Wo Contrast  Result Date: 05/02/2018 CLINICAL DATA:  Hemoptysis with elevated D-dimer. Diabetes. Presents with heroin overdose and dyspnea. EXAM: CT ANGIOGRAPHY CHEST WITH CONTRAST TECHNIQUE: Multidetector CT imaging of the chest was performed using the standard protocol during bolus administration of intravenous contrast. Multiplanar CT image reconstructions and MIPs were obtained to evaluate the vascular anatomy. CONTRAST:  ISOVUE-370 IOPAMIDOL (ISOVUE-370) INJECTION 76% COMPARISON:  None. FINDINGS: Cardiovascular: There is moderately good opacification of the central and segmental pulmonary arteries. No focal filling defects. No evidence of significant pulmonary embolus. Normal caliber thoracic aorta. Great vessel origins are patent. No evidence of dissection. Normal heart size. No pericardial effusions. Mediastinum/Nodes: Esophagus is decompressed. No significant lymphadenopathy in the chest. Lungs/Pleura: Diffuse alveolar infiltrates throughout the left lung with patchy alveolar infiltrates throughout the right lung. Appearances are nonspecific in could represent edema, multifocal pneumonia, aspiration, or alveolar hemorrhage. Airways are patent. No pleural effusions. No pneumothorax. Upper Abdomen: No acute abnormality. Musculoskeletal: No chest wall abnormality. No acute or significant osseous findings. Review of the MIP images confirms the above findings. IMPRESSION: 1. No evidence of significant pulmonary embolus. 2. Diffuse alveolar infiltrates throughout both lungs, greater on the left. Differential diagnosis includes edema, multifocal pneumonia, aspiration, or alveolar hemorrhage. Electronically Signed   By: Burman Nieves M.D.   On: 05/02/2018 01:26    Microbiology: Recent Results (from the past 240 hour(s))  Culture, blood (routine x 2) Call MD if  unable to obtain prior to antibiotics being given     Status: None  (Preliminary result)   Collection Time: 05/01/18  9:49 PM  Result Value Ref Range Status   Specimen Description BLOOD RIGHT ARM  Final   Special Requests   Final    BOTTLES DRAWN AEROBIC AND ANAEROBIC ANA BCAV AEB BCHV   Culture   Final    NO GROWTH 2 DAYS Performed at [facility], [address], [place], [place] [address]    Report Status PENDING  Incomplete  Culture, blood (routine x 2) Call MD if unable to obtain prior to antibiotics being given     Status: None (Preliminary result)   Collection Time: 05/01/18  9:49 PM  Result Value Ref Range Status   Specimen Description BLOOD RIGHT WRIST  Final   Special Requests   Final    BOTTLES DRAWN AEROBIC AND ANAEROBIC ANA BCAV AEB BCHV   Culture   Final    NO GROWTH 2 DAYS Performed at [facility], [address] [place], [place] [address]    Report Status PENDING  Incomplete  Culture, respiratory     Status: None (Preliminary result)   Collection Time: 05/02/18 11:35 AM  Result Value Ref Range Status   Specimen Description EXPECTORATED SPUTUM  Final   Special Requests   Final    NONE Performed at [facility], [address], [place], [place] [address]    Gram Stain   Final    FEW WBC PRESENT, PREDOMINANTLY PMN RARE SQUAMOUS EPITHELIAL CELLS PRESENT FEW GRAM POSITIVE RODS RARE GRAM NEGATIVE RODS RARE GRAM POSITIVE COCCI    Culture   Final    CULTURE REINCUBATED FOR BETTER GROWTH Performed at [facility], [address], [place], [place] [address]    Report Status PENDING  Incomplete     Labs: Basic Metabolic Panel: Recent Labs  Lab 05/01/18 2106 05/02/18 0319 05/03/18 0618  NA 139 138 138  K 3.7 4.1 4.0  CL 103 103 102  CO2 28 28 27   GLUCOSE 153* 157* 202*  BUN 11 8 10   CREATININE 0.90 0.68 0.85  CALCIUM 8.9 8.7* 9.0   Liver Function Tests: Recent Labs  Lab 05/02/18 0319  AST 109*  ALT 66*  ALKPHOS 88  BILITOT 0.9  PROT 6.5  ALBUMIN 3.7   CBC: Recent Labs  Lab 05/01/18 2106 05/02/18 0319  05/03/18 0618  WBC 17.6* 17.6* 9.2  NEUTROABS 16.0*  --   --   HGB 14.8 13.8 14.4  HCT 45.9 42.6 44.5  MCV 96.0 98.2 98.9  PLT 284 252 242   Cardiac Enzymes: Recent Labs  Lab 05/01/18 2106 05/02/18 0319 05/02/18 0855  TROPONINI <0.03 <0.03 <0.03    CBG: Recent Labs  Lab 05/02/18 1122 05/02/18 1623 05/02/18 2143 05/03/18 0728 05/03/18 1113  GLUCAP 234* 271* 182* 234* 240*     Signed:  Vassie Loll MD.  [facility] 05/03/2018, 10:55 PM

## 2018-05-03 NOTE — Progress Notes (Signed)
Patient is to be discharged home and in stable condition. Patient's IV and telemetry removed, WNL. Patient given discharge instructions and verbalized understanding. Patient's parents here as transportation. Patient to be escorted out by staff via wheelchair.  Quita Skye, RN

## 2018-05-03 NOTE — Care Management (Signed)
Per [place] [facility] [employer] Co-pay amount for Lantus  $40.00 for a 30 day supply.,Co-pay for regular insulin 50-75 $ 40.00 for 30 day supply.No PA required. Pharmacies in-network are : [facility], [facility].

## 2018-05-04 LAB — CULTURE, RESPIRATORY W GRAM STAIN

## 2018-05-04 LAB — CULTURE, RESPIRATORY: CULTURE: NORMAL

## 2018-05-06 LAB — CULTURE, BLOOD (ROUTINE X 2)
CULTURE: NO GROWTH
Culture: NO GROWTH

## 2018-05-08 ENCOUNTER — Encounter (HOSPITAL_COMMUNITY): Payer: Self-pay | Admitting: *Deleted

## 2018-05-23 DIAGNOSIS — Z23 Encounter for immunization: Secondary | ICD-10-CM | POA: Diagnosis not present

## 2018-06-22 ENCOUNTER — Ambulatory Visit: Payer: BLUE CROSS/BLUE SHIELD | Admitting: Family Medicine

## 2018-06-22 ENCOUNTER — Encounter: Payer: Self-pay | Admitting: Family Medicine

## 2018-06-22 VITALS — BP 112/78 | Ht 72.0 in | Wt 177.0 lb

## 2018-06-22 DIAGNOSIS — J019 Acute sinusitis, unspecified: Secondary | ICD-10-CM | POA: Diagnosis not present

## 2018-06-22 DIAGNOSIS — Z13220 Encounter for screening for lipoid disorders: Secondary | ICD-10-CM | POA: Diagnosis not present

## 2018-06-22 DIAGNOSIS — B9689 Other specified bacterial agents as the cause of diseases classified elsewhere: Secondary | ICD-10-CM

## 2018-06-22 DIAGNOSIS — E039 Hypothyroidism, unspecified: Secondary | ICD-10-CM

## 2018-06-22 DIAGNOSIS — E109 Type 1 diabetes mellitus without complications: Secondary | ICD-10-CM

## 2018-06-22 MED ORDER — LEVOTHYROXINE SODIUM 100 MCG PO TABS: 100.0000 ug | ORAL_TABLET | Freq: Every day | ORAL | 6 refills | Status: AC

## 2018-06-22 MED ORDER — AMOXICILLIN-POT CLAVULANATE 875-125 MG PO TABS: 1.0000 | ORAL_TABLET | Freq: Two times a day (BID) | ORAL | 0 refills | Status: AC

## 2018-06-22 NOTE — Progress Notes (Signed)
   Subjective:    Patient ID: Chase ApleyBenjamin L Hover, male    DOB: [birth year], 27 y.o.   MRN: [id number]  HPI Patient states he is here today due to a dry cough, He says he has some nasal congestion,and headache. This has been on going since Tuesday. He has been taking fever reducers and otc cough medication. He stats yesterday and today his temp was 100.1. 2 weeks cold sx Pain in ears Cough Nasal dc No body aches Fatigue   Review of Systems  Constitutional: Negative for activity change, chills and fever.  HENT: Positive for congestion and rhinorrhea. Negative for ear pain.   Eyes: Negative for discharge.  Respiratory: Positive for cough. Negative for wheezing.   Cardiovascular: Negative for chest pain.  Gastrointestinal: Negative for nausea and vomiting.  Musculoskeletal: Negative for arthralgias.       Objective:   Physical Exam  Constitutional: He appears well-developed.  HENT:  Head: Normocephalic.  Mouth/Throat: Oropharynx is clear and moist. No oropharyngeal exudate.  Neck: Normal range of motion.  Cardiovascular: Normal rate, regular rhythm and normal heart sounds.  No murmur heard. Pulmonary/Chest: Effort normal and breath sounds normal. He has no wheezes.  Lymphadenopathy:    He has no cervical adenopathy.  Neurological: He exhibits normal muscle tone.  Skin: Skin is warm and dry.  Nursing note and vitals reviewed.         Assessment & Plan:  Patient was seen today for upper respiratory illness. It is felt that the patient is dealing with sinusitis.  Antibiotics were prescribed today. Importance of compliance with medication was discussed.  Symptoms should gradually resolve over the course of the next several days. If high fevers, progressive illness, difficulty breathing, worsening condition or failure for symptoms to improve over the next several days then the patient is to follow-up.  If any emergent conditions the patient is to follow-up in the emergency  department otherwise to follow-up in the office. Antibiotics was sent in  Patient does have diabetes hypothyroidism has not done a good job taking care of himself recently need to see endocrinology he does agree to do lab work we will go ahead and set him up with endocrinology patient does not want to go back to see Dr. Fransico HimNida

## 2018-06-25 NOTE — Addendum Note (Signed)
Addended by: Margaretha SheffieldBROWN, Maxwell Lemen S on: 06/25/2018 08:35 AM   Modules accepted: Orders

## 2018-06-25 NOTE — Progress Notes (Signed)
Referral ordered in [facility]. 

## 2018-07-03 ENCOUNTER — Encounter: Payer: Self-pay | Admitting: Family Medicine

## 2018-10-01 ENCOUNTER — Telehealth: Payer: Self-pay | Admitting: Family Medicine

## 2018-10-01 ENCOUNTER — Other Ambulatory Visit: Payer: Self-pay | Admitting: *Deleted

## 2018-10-01 MED ORDER — OSELTAMIVIR PHOSPHATE 75 MG PO CAPS: 75.0000 mg | ORAL_CAPSULE | Freq: Every day | ORAL | 0 refills | Status: AC

## 2018-10-01 NOTE — Telephone Encounter (Signed)
Both of pt's kids have been diagnosed with the flu. Pt is a type 1 diabetic and would like to know if there is anything he should do to take precaution to not get the flu. Pt states he is not showing symptoms now.

## 2018-10-01 NOTE — Telephone Encounter (Signed)
Can take preventive tamiflu 75 qd for ten d

## 2018-10-01 NOTE — Telephone Encounter (Signed)
Discussed with pt and med sent to pharm.  

## 2019-03-15 IMAGING — CT CT ANGIO CHEST
2 of 6 series · 18 of 46 positions shown · IV contrast (Isovue)
Comparison: None.

CLINICAL DATA: Hemoptysis with elevated D-dimer. Diabetes. Presents
with heroin overdose and dyspnea.

EXAM:
CT ANGIOGRAPHY CHEST WITH CONTRAST
TECHNIQUE: Multidetector CT imaging of the chest was performed using the
standard protocol during bolus administration of intravenous
contrast. Multiplanar CT image reconstructions and MIPs were
obtained to evaluate the vascular anatomy.
CONTRAST:  100mL [id number] IOPAMIDOL ([id number]) INJECTION 76%

[Series 5: thins · axial · 0.72mm/px · z∈[-220,+52]mm · 15 of 298 slices shown]
[im 13/298  lung]
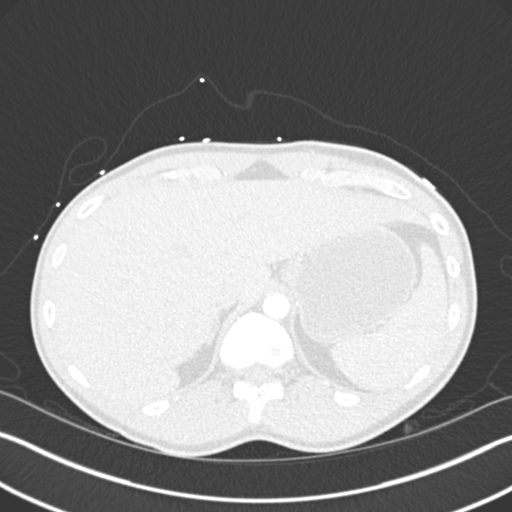
[im 39/298  soft-tissue]
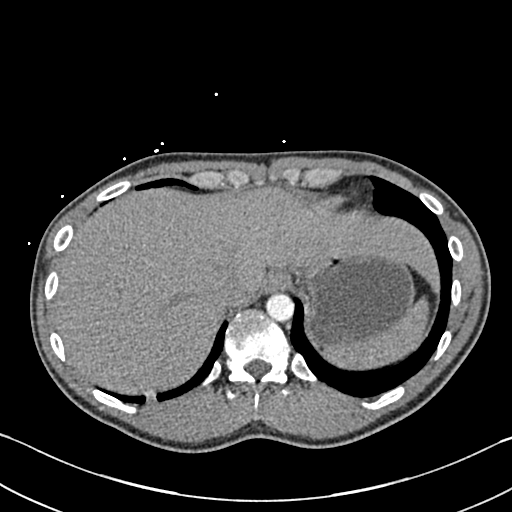
[im 52/298  lung]
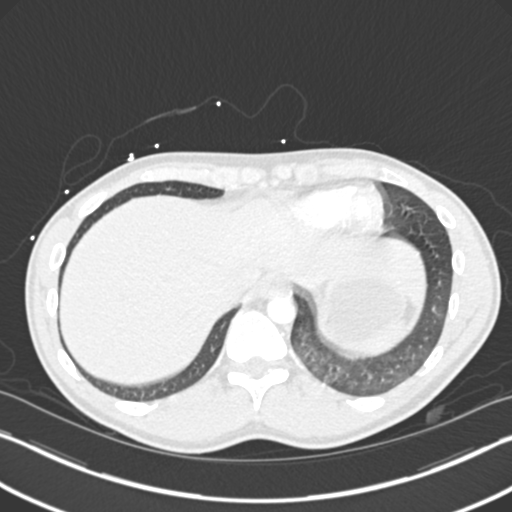
[im 78/298  soft-tissue]
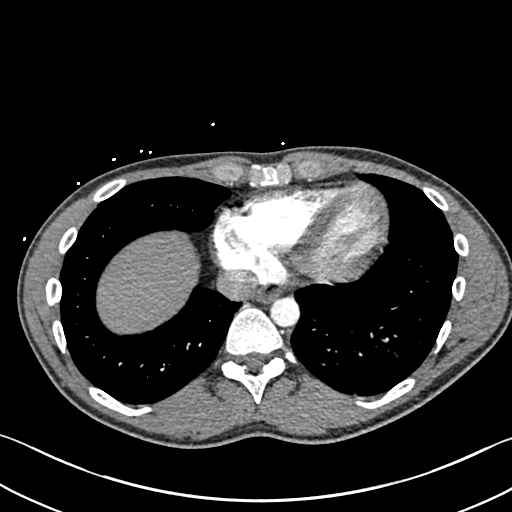
[im 91/298  lung]
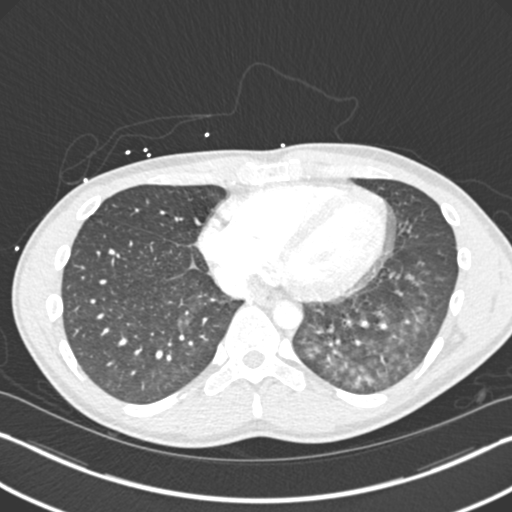
[im 117/298  soft-tissue]
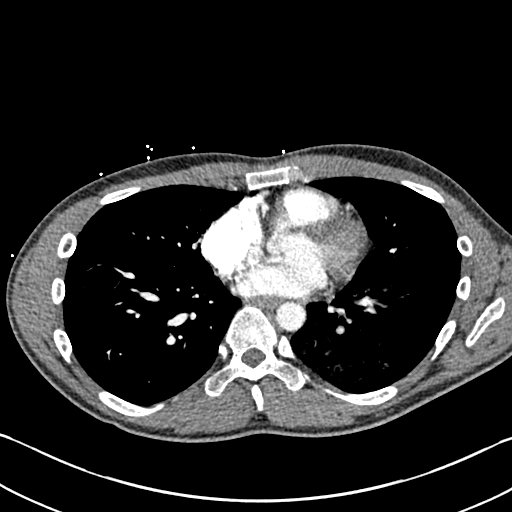
[im 130/298  lung]
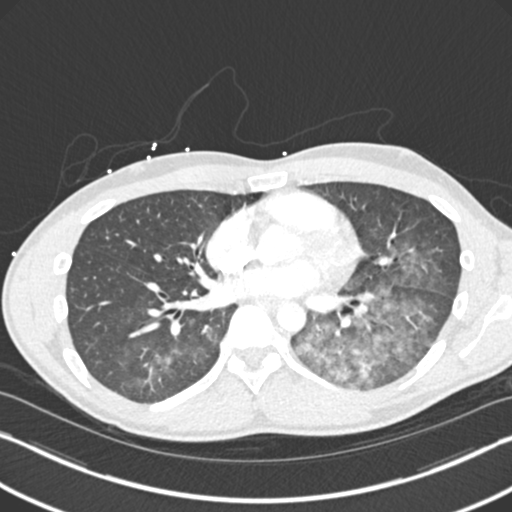
[im 155/298  soft-tissue]
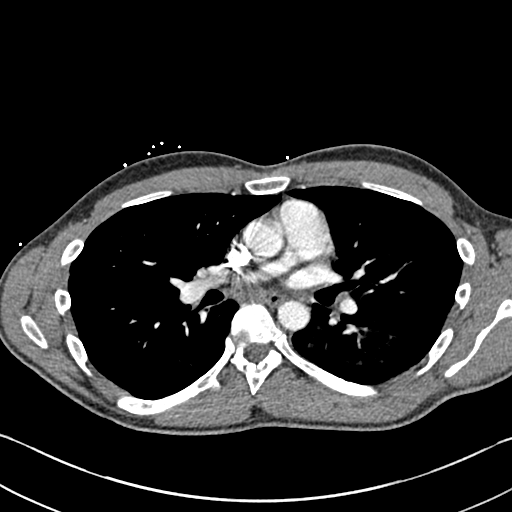
[im 168/298  lung]
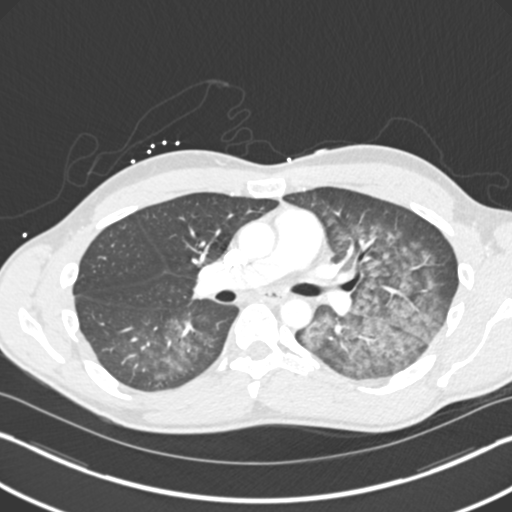
[im 181/298  soft-tissue]
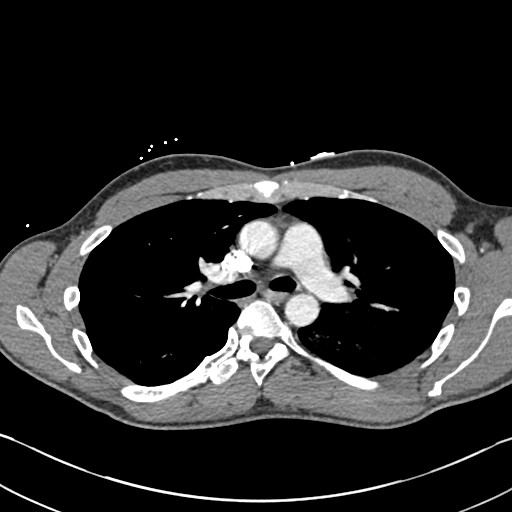
[im 207/298  lung]
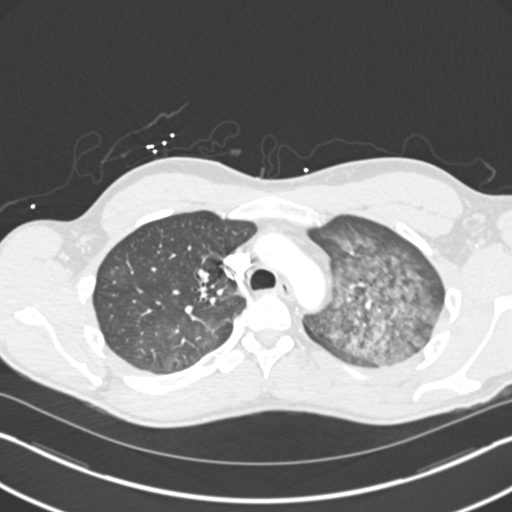
[im 220/298  soft-tissue]
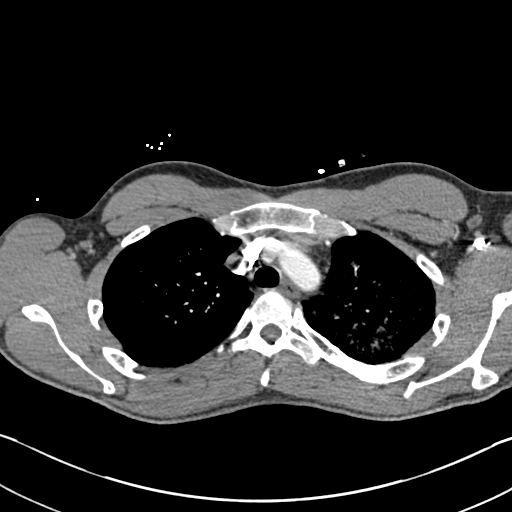
[im 246/298  lung]
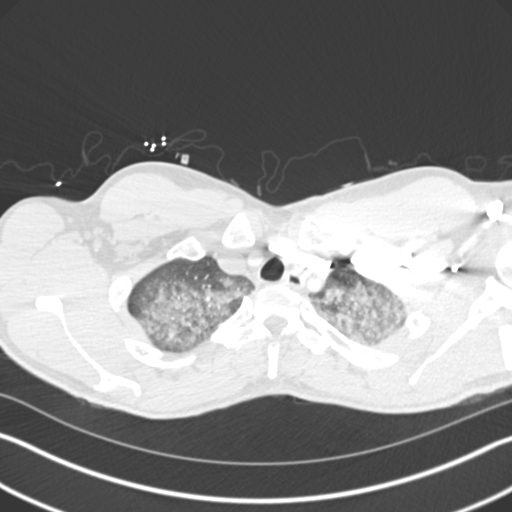
[im 259/298  soft-tissue]
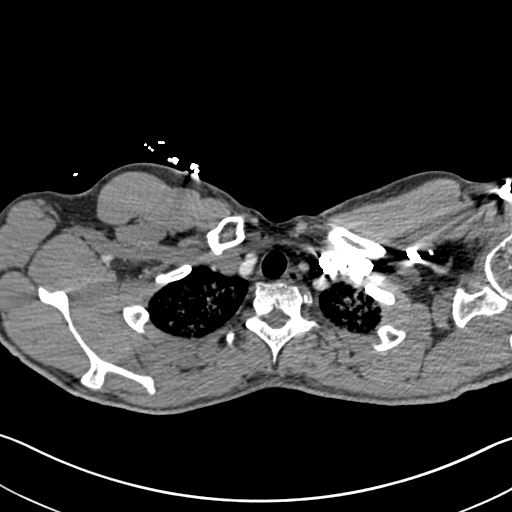
[im 285/298  lung]
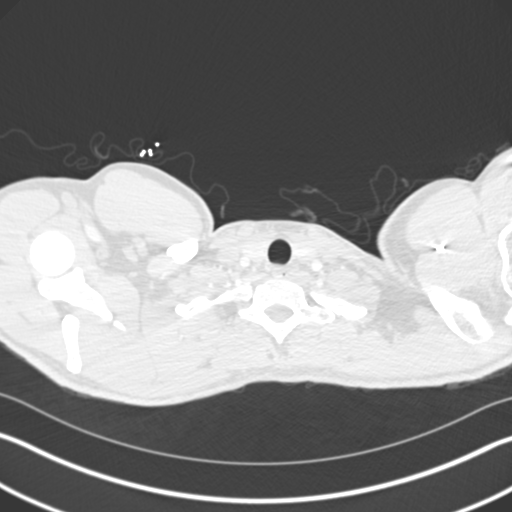

[Series 8: coronal mpr · coronal · 0.56mm/px · 3 of 108 slices shown]
[im 27/108  soft-tissue]
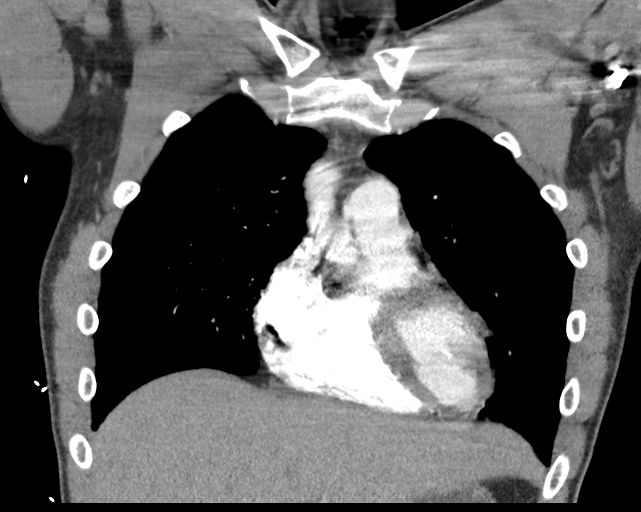
[im 54/108  soft-tissue]
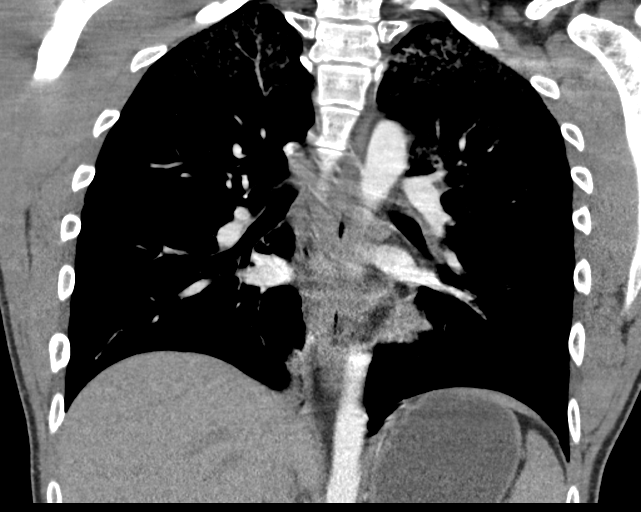
[im 81/108  soft-tissue]
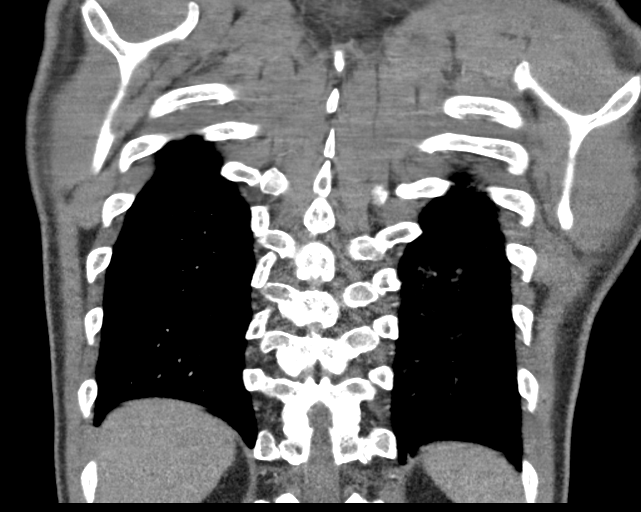

[18 of 46 positions shown; findings below may reference images not displayed]

FINDINGS: Cardiovascular: There is moderately good opacification of the
central and segmental pulmonary arteries. No focal filling defects.
No evidence of significant pulmonary embolus. Normal caliber
thoracic aorta. Great vessel origins are patent. No evidence of
dissection. Normal heart size. No pericardial effusions.

Mediastinum/Nodes: Esophagus is decompressed. No significant
lymphadenopathy in the chest.

Lungs/Pleura: Diffuse alveolar infiltrates throughout the left lung
with patchy alveolar infiltrates throughout the right lung.
Appearances are nonspecific in could represent edema, multifocal
pneumonia, aspiration, or alveolar hemorrhage. Airways are patent.
No pleural effusions. No pneumothorax.

Upper Abdomen: No acute abnormality.

Musculoskeletal: No chest wall abnormality. No acute or significant
osseous findings.

Review of the MIP images confirms the above findings.
IMPRESSION: 1. No evidence of significant pulmonary embolus.
2. Diffuse alveolar infiltrates throughout both lungs, greater on
the left. Differential diagnosis includes edema, multifocal
pneumonia, aspiration, or alveolar hemorrhage.

## 2019-08-19 ENCOUNTER — Encounter: Payer: Self-pay | Admitting: Family Medicine

## 2021-07-09 DIAGNOSIS — F99 Mental disorder, not otherwise specified: Secondary | ICD-10-CM | POA: Diagnosis not present
